# Patient Record
Sex: Female | Born: 1955 | Race: White | Hispanic: No
Health system: Western US, Academic
[De-identification: ages and names within clinical notes are randomized; demographics above are authoritative.]

## PROBLEM LIST (undated history)

## (undated) DIAGNOSIS — E119 Type 2 diabetes mellitus without complications: Secondary | ICD-10-CM

## (undated) DIAGNOSIS — I10 Essential (primary) hypertension: Secondary | ICD-10-CM

## (undated) DIAGNOSIS — E785 Hyperlipidemia, unspecified: Secondary | ICD-10-CM

## (undated) DIAGNOSIS — T7840XA Allergy, unspecified, initial encounter: Secondary | ICD-10-CM

## (undated) HISTORY — DX: Type 2 diabetes mellitus without complications: E11.9

## (undated) HISTORY — DX: Allergy, unspecified, initial encounter: T78.40XA

## (undated) HISTORY — DX: Hyperlipidemia, unspecified: E78.5

---

## 2009-08-18 ENCOUNTER — Ambulatory Visit: Payer: Self-pay | Admitting: Internal Medicine

## 2010-06-25 LAB — HM COLONOSCOPY: HM Colonoscopy: NORMAL

## 2011-05-22 ENCOUNTER — Ambulatory Visit: Payer: Self-pay | Admitting: Internal Medicine

## 2011-06-03 ENCOUNTER — Ambulatory Visit: Payer: Self-pay | Admitting: Internal Medicine

## 2011-07-01 ENCOUNTER — Ambulatory Visit: Payer: Self-pay | Admitting: Gastroenterology

## 2011-09-18 DIAGNOSIS — J309 Allergic rhinitis, unspecified: Secondary | ICD-10-CM | POA: Insufficient documentation

## 2012-05-26 ENCOUNTER — Ambulatory Visit: Payer: Self-pay | Admitting: Internal Medicine

## 2013-05-31 LAB — HM PAP SMEAR: HM PAP: NORMAL

## 2013-06-02 ENCOUNTER — Ambulatory Visit: Payer: Self-pay | Admitting: Family Medicine

## 2013-07-05 ENCOUNTER — Ambulatory Visit: Payer: Self-pay | Admitting: Adult Health

## 2014-06-03 LAB — HEPATIC FUNCTION PANEL
ALK PHOS: 78 U/L (ref 25–125)
ALT: 31 U/L (ref 7–35)
AST: 23 U/L (ref 13–35)
BILIRUBIN, TOTAL: 0.4 mg/dL

## 2014-06-03 LAB — BASIC METABOLIC PANEL
BUN: 13 mg/dL (ref 4–21)
Creatinine: 0.8 mg/dL (ref <=1.1)
Glucose: 122 mg/dL
Potassium: 4.4 mmol/L (ref 3.4–5.3)
Sodium: 140 mmol/L (ref 137–147)

## 2014-06-03 LAB — CBC AND DIFFERENTIAL
HCT: 41 % (ref 36–46)
Hemoglobin: 14.5 g/dL (ref 12.0–16.0)
NEUTROS ABS: 44 /uL
PLATELETS: 358 10*3/uL (ref 150–399)
WBC: 6.3 10^3/mL

## 2014-06-03 LAB — HEMOGLOBIN A1C: HEMOGLOBIN A1C: 6.5 % — AB (ref 4.0–6.0)

## 2014-06-03 LAB — LIPID PANEL
CHOLESTEROL: 248 mg/dL — AB (ref 0–200)
HDL: 53 mg/dL (ref 35–70)
LDL CALC: 157 mg/dL
LDl/HDL Ratio: 3
TRIGLYCERIDES: 189 mg/dL — AB (ref 40–160)

## 2014-06-03 LAB — HM MAMMOGRAPHY: HM Mammogram: ABNORMAL

## 2014-06-03 LAB — TSH: TSH: 2.58 u[IU]/mL (ref 0.41–5.90)

## 2014-06-15 ENCOUNTER — Ambulatory Visit: Payer: Self-pay | Admitting: Family Medicine

## 2014-07-04 ENCOUNTER — Ambulatory Visit: Payer: Self-pay | Admitting: Family Medicine

## 2014-10-20 DIAGNOSIS — Z111 Encounter for screening for respiratory tuberculosis: Secondary | ICD-10-CM | POA: Insufficient documentation

## 2014-10-20 DIAGNOSIS — Z124 Encounter for screening for malignant neoplasm of cervix: Secondary | ICD-10-CM | POA: Insufficient documentation

## 2014-10-20 DIAGNOSIS — M25569 Pain in unspecified knee: Secondary | ICD-10-CM | POA: Insufficient documentation

## 2014-10-20 DIAGNOSIS — Z2821 Immunization not carried out because of patient refusal: Secondary | ICD-10-CM | POA: Insufficient documentation

## 2014-10-20 DIAGNOSIS — Z1239 Encounter for other screening for malignant neoplasm of breast: Secondary | ICD-10-CM | POA: Insufficient documentation

## 2014-10-20 DIAGNOSIS — Z0000 Encounter for general adult medical examination without abnormal findings: Secondary | ICD-10-CM | POA: Insufficient documentation

## 2014-10-20 DIAGNOSIS — E119 Type 2 diabetes mellitus without complications: Secondary | ICD-10-CM | POA: Insufficient documentation

## 2014-10-20 DIAGNOSIS — Z131 Encounter for screening for diabetes mellitus: Secondary | ICD-10-CM | POA: Insufficient documentation

## 2014-10-20 DIAGNOSIS — I10 Essential (primary) hypertension: Secondary | ICD-10-CM | POA: Insufficient documentation

## 2014-10-20 DIAGNOSIS — Z23 Encounter for immunization: Secondary | ICD-10-CM | POA: Insufficient documentation

## 2014-10-20 DIAGNOSIS — E663 Overweight: Secondary | ICD-10-CM | POA: Insufficient documentation

## 2014-10-20 DIAGNOSIS — Z87898 Personal history of other specified conditions: Secondary | ICD-10-CM | POA: Insufficient documentation

## 2014-10-20 DIAGNOSIS — IMO0002 Reserved for concepts with insufficient information to code with codable children: Secondary | ICD-10-CM | POA: Insufficient documentation

## 2014-10-20 DIAGNOSIS — Z1331 Encounter for screening for depression: Secondary | ICD-10-CM | POA: Insufficient documentation

## 2014-10-20 DIAGNOSIS — E785 Hyperlipidemia, unspecified: Secondary | ICD-10-CM | POA: Insufficient documentation

## 2014-12-12 ENCOUNTER — Ambulatory Visit: Payer: Self-pay | Admitting: Family Medicine

## 2014-12-19 ENCOUNTER — Ambulatory Visit: Payer: Self-pay | Admitting: Family Medicine

## 2014-12-21 ENCOUNTER — Ambulatory Visit (INDEPENDENT_AMBULATORY_CARE_PROVIDER_SITE_OTHER): Payer: BLUE CROSS/BLUE SHIELD | Admitting: Family Medicine

## 2014-12-21 ENCOUNTER — Encounter: Payer: Self-pay | Admitting: Family Medicine

## 2014-12-21 VITALS — BP 124/76 | HR 76 | Temp 98.0°F | Resp 18 | Ht 65.0 in | Wt 171.9 lb

## 2014-12-21 DIAGNOSIS — E119 Type 2 diabetes mellitus without complications: Secondary | ICD-10-CM | POA: Diagnosis not present

## 2014-12-21 LAB — POCT UA - MICROALBUMIN: Microalbumin Ur, POC: 50 mg/L

## 2014-12-21 LAB — POCT GLYCOSYLATED HEMOGLOBIN (HGB A1C): Hemoglobin A1C: 6.3

## 2014-12-21 MED ORDER — METFORMIN HCL 500 MG PO TABS: 500.0000 mg | ORAL_TABLET | Freq: Every day | ORAL | Status: DC

## 2014-12-21 NOTE — Progress Notes (Signed)
Name: Karen Burgess   MRN: [id number]    DOB: [birth year]   Date:12/21/2014       Progress Note  Subjective  Chief Complaint  Chief Complaint  Patient presents with  . Follow-up    patient is here for a 85-month follow-up. patient states everything is going well.    HPI  Patient is here for routine follow up of Diabetes Type II.  Current diabetes medication regimen includes Metformin  one a day. Patient is not taking medications as instructed. Taking metformin  one a day PRN basis but working mostly on lifestyle control. Overall the patient feels that their blood glucose is well controlled. Not checking blood glucose regularly. Associated diseases include HLD.  Last diabetic eye exam 11/23/2014 with normal findings. Associated symptoms do not include chest pain, shortness of breath, paresthesias, neuropathy, visual changes.    Patient Active Problem List   Diagnosis Date Noted  . Routine general medical examination at a health care facility 10/20/2014  . Blood pressure elevated without history of HTN 10/20/2014  . Screening for depression 10/20/2014  . Gravida 1 10/20/2014  . Calcium blood increased 10/20/2014  . HLD (hyperlipidemia) 10/20/2014  . Influenza vaccination declined 10/20/2014  . Gonalgia 10/20/2014  . Excess weight 10/20/2014  . Parity 1 10/20/2014  . Breast screening 10/20/2014  . Pap smear for cervical cancer screening 10/20/2014  . Screening for diabetes mellitus 10/20/2014  . Need for vaccination 10/20/2014  . Screening examination for pulmonary tuberculosis 10/20/2014  . Diabetes mellitus type 2, controlled, without complications 10/20/2014  . Allergic rhinitis 09/18/2011    History  Substance Use Topics  . Smoking status: Former Smoker    Types: Cigarettes    Quit date: 06/09/1984  . Smokeless tobacco: Not on file  . Alcohol Use: 0.0 oz/week    0 Standard drinks or equivalent per week     Comment: Occasional     Current outpatient  prescriptions:  .  metFORMIN (GLUCOPHAGE) 500 MG tablet, Take 1 tablet by mouth daily., Disp: , Rfl:   History reviewed. No pertinent past surgical history.  Family History  Problem Relation Age of Onset  . Prostate cancer Father   . Breast cancer Mother   . Breast cancer Sister   . Breast cancer Cousin     died at age 45  . Diabetes Mellitus I Paternal Aunt     No Known Allergies   Review of Systems  CONSTITUTIONAL: No significant weight changes, fever, chills, weakness or fatigue.  HEENT:  - Eyes: No visual changes.  - Ears: No auditory changes. No pain.  - Nose: No sneezing, congestion, runny nose. - Throat: No sore throat. No changes in swallowing. SKIN: No rash or itching.  CARDIOVASCULAR: No chest pain, chest pressure or chest discomfort. No palpitations or edema.  RESPIRATORY: No shortness of breath, cough or sputum.  GASTROINTESTINAL: No anorexia, nausea, vomiting. No changes in bowel habits. No abdominal pain or blood.  GENITOURINARY: No dysuria. No frequency. No discharge.  NEUROLOGICAL: No headache, dizziness, syncope, paralysis, ataxia, numbness or tingling in the extremities. No memory changes. No change in bowel or bladder control.  MUSCULOSKELETAL: No joint pain. No muscle pain. HEMATOLOGIC: No anemia, bleeding or bruising.  LYMPHATICS: No enlarged lymph nodes.  PSYCHIATRIC: No change in mood. No change in sleep pattern.  ENDOCRINOLOGIC: No reports of sweating, cold or heat intolerance. No polyuria or polydipsia.      Objective  BP 124/76 mmHg  Pulse 76  Temp(Src) 98  F (36.7 C) (Oral)  Resp 18  Ht 5\' 5"  (1.651 m)  Wt 171 lb 14.4 oz (77.973 kg)  BMI 28.61 kg/m2  SpO2 94% Body mass index is 28.61 kg/(m^2).  Physical Exam  Constitutional: Patient appears well-developed and well-nourished. In no distress.  HEENT:  - Head: Normocephalic and atraumatic.  - Ears: Bilateral TMs gray, no erythema or effusion - Nose: Nasal mucosa moist -  Mouth/Throat: Oropharynx is clear and moist. No tonsillar hypertrophy or erythema. No post nasal drainage.  - Eyes: Conjunctivae clear, EOM movements normal. PERRLA. No scleral icterus.  Neck: Normal range of motion. Neck supple. No JVD present. No thyromegaly present.  Cardiovascular: Normal rate, regular rhythm and normal heart sounds.  No murmur heard.  Pulmonary/Chest: Effort normal and breath sounds normal. No respiratory distress. Musculoskeletal: Normal range of motion bilateral UE and LE, no joint effusions. Peripheral vascular: Bilateral LE no edema. Neurological: CN II-XII grossly intact with no focal deficits. Alert and oriented to person, place, and time. Coordination, balance, strength, speech and gait are normal.  Skin: Skin is warm and dry. No rash noted. No erythema.  Psychiatric: Patient has a normal mood and affect. Behavior is normal in office today. Judgment and thought content normal in office today.  Diabetic Foot Exam - Simple   Simple Foot Form  Diabetic Foot exam was performed with the following findings:  Yes 12/21/2014  8:53 AM  Visual Inspection  No deformities, no ulcerations, no other skin breakdown bilaterally:  Yes  Sensation Testing  Intact to touch and monofilament testing bilaterally:  Yes  Pulse Check  Posterior Tibialis and Dorsalis pulse intact bilaterally:  Yes  Comments      Assessment & Plan  1. Diabetes mellitus type 2, controlled, without complications Hba1c 6.3% today which is at goal <6.5%. Continue Metformin 500mg  po qday prn with lifestyle changes.  At next visit we will get full lab panel, she has been instructed to call me 2-3 weeks prior to her appointment so I can order lab work.  - POCT UA - Microalbumin - POCT HgB A1C - metFORMIN (GLUCOPHAGE) 500 MG tablet; Take 1 tablet (500 mg total) by mouth daily.  Dispense: 30 tablet; Refill: 5

## 2015-06-06 ENCOUNTER — Ambulatory Visit
Admission: RE | Admit: 2015-06-06 | Discharge: 2015-06-06 | Disposition: A | Discharge status: Home / Self Care | Payer: BLUE CROSS/BLUE SHIELD | Source: Ambulatory Visit | Attending: Family Medicine | Admitting: Family Medicine

## 2015-06-06 ENCOUNTER — Encounter: Payer: Self-pay | Admitting: Family Medicine

## 2015-06-06 ENCOUNTER — Ambulatory Visit (INDEPENDENT_AMBULATORY_CARE_PROVIDER_SITE_OTHER): Payer: BLUE CROSS/BLUE SHIELD | Admitting: Family Medicine

## 2015-06-06 VITALS — BP 122/86 | HR 76 | Temp 98.6°F | Resp 16 | Ht 65.0 in | Wt 168.4 lb

## 2015-06-06 DIAGNOSIS — Z1231 Encounter for screening mammogram for malignant neoplasm of breast: Secondary | ICD-10-CM | POA: Diagnosis not present

## 2015-06-06 DIAGNOSIS — Z113 Encounter for screening for infections with a predominantly sexual mode of transmission: Secondary | ICD-10-CM | POA: Diagnosis not present

## 2015-06-06 DIAGNOSIS — E119 Type 2 diabetes mellitus without complications: Secondary | ICD-10-CM

## 2015-06-06 DIAGNOSIS — E785 Hyperlipidemia, unspecified: Secondary | ICD-10-CM

## 2015-06-06 DIAGNOSIS — Z2821 Immunization not carried out because of patient refusal: Secondary | ICD-10-CM | POA: Diagnosis not present

## 2015-06-06 DIAGNOSIS — M25512 Pain in left shoulder: Secondary | ICD-10-CM

## 2015-06-06 DIAGNOSIS — Z0000 Encounter for general adult medical examination without abnormal findings: Secondary | ICD-10-CM | POA: Insufficient documentation

## 2015-06-06 DIAGNOSIS — M19012 Primary osteoarthritis, left shoulder: Secondary | ICD-10-CM | POA: Diagnosis not present

## 2015-06-06 DIAGNOSIS — E1169 Type 2 diabetes mellitus with other specified complication: Secondary | ICD-10-CM | POA: Insufficient documentation

## 2015-06-06 LAB — POCT UA - MICROALBUMIN: MICROALBUMIN (UR) POC: 20 mg/L

## 2015-06-06 NOTE — Progress Notes (Signed)
Name: Karen Burgess   MRN: [id number]    DOB: [birth year]   Date:06/06/2015       Progress Note  Subjective  Chief Complaint  Chief Complaint  Patient presents with  . Annual Exam    HPI  Patient is here today for a Complete Female Physical Exam:  The patient has has no unusual complaints and complains of left shoulder pain. Intermitent ache scaled 3/10, with some associated restricted overhead ROM. Not debilitating or restrictive but has been going on for 3 months and she wanted to address it. Has never had rotator cuff issues or bursitis. Overall feels healthy. Diet is well balanced. Avoids sugars, artificial sweetners and excessive carbohydrates. In general does exercise, but irregularly. Sees dentist regularly and addresses vision concerns with ophthalmologist if applicable. Declines flu shot today.  Menstrual history is positive for post menopausal with no vaginal discharge or pelvic symptoms.  Current diabetes medication regimen includes Metformin  one a day. Patient is not taking medications as instructed. Taking metformin  one a day PRN basis but working mostly on lifestyle control. Overall the patient feels that their blood glucose is well controlled. Not checking blood glucose regularly. Associated diseases include HLD, not on statin therapy. Last diabetic eye exam 11/23/2014 with normal findings. Associated symptoms do not include chest pain, shortness of breath, paresthesias, neuropathy, visual changes.   Past Medical History  Diagnosis Date  . Diabetes mellitus without complication (HCC)     patient was classified as diabetic based upon lab results, but is well controlled by diet and exercise  . Allergy   . Hyperlipidemia     No past surgical history on file.  Family History  Problem Relation Age of Onset  . Prostate cancer Father   . Breast cancer Mother   . Breast cancer Sister   . Breast cancer Cousin     died at age 56  . Diabetes Mellitus I Paternal  Aunt     Social History   Social History  . Marital Status: Married    Spouse Name: Ceriah Kohler  . Number of Children: 2  . Years of Education: N/A   Occupational History  . [employer]   Social History Main Topics  . Smoking status: Former Smoker    Types: Cigarettes    Quit date: 06/09/1984  . Smokeless tobacco: Not on file  . Alcohol Use: 0.0 oz/week    0 Standard drinks or equivalent per week     Comment: Occasional  . Drug Use: No  . Sexual Activity:    Partners: Male    Birth Control/ Protection: None   Other Topics Concern  . Not on file   Social History Narrative     Current outpatient prescriptions:  .  Bee Pollen 580 MG CAPS, Take 1 tablet by mouth 3 (three) times daily., Disp: , Rfl:  .  metFORMIN (GLUCOPHAGE) 500 MG tablet, Take 1 tablet (500 mg total) by mouth daily., Disp: 30 tablet, Rfl: 5  No Known Allergies  ROS  CONSTITUTIONAL: No significant weight changes, fever, chills, weakness or fatigue.  HEENT:  - Eyes: No visual changes.  - Ears: No auditory changes. No pain.  - Nose: No sneezing, congestion, runny nose. - Throat: No sore throat. No changes in swallowing. SKIN: No rash or itching.  CARDIOVASCULAR: No chest pain, chest pressure or chest discomfort. No palpitations or edema.  RESPIRATORY: No shortness of breath, cough or sputum.  GASTROINTESTINAL: No anorexia, nausea, vomiting. No changes in bowel habits. No abdominal pain or blood.  GENITOURINARY: No dysuria. No frequency. No discharge.  NEUROLOGICAL: No headache, dizziness, syncope, paralysis, ataxia, numbness or tingling in the extremities. No memory changes. No change in bowel or bladder control.  MUSCULOSKELETAL: Left shoulder joint pain. No muscle pain. HEMATOLOGIC: No anemia, bleeding or bruising.  LYMPHATICS: No enlarged lymph nodes.  PSYCHIATRIC: No change in mood. No change in sleep pattern.  ENDOCRINOLOGIC: No reports of sweating, cold or  heat intolerance. No polyuria or polydipsia.   Objective  Filed Vitals:   06/06/15 0814  BP: 122/86  Pulse: 76  Temp: 98.6 F (37 C)  TempSrc: Oral  Resp: 16  Height:  (1.651 m)  Weight: 168 lb 6.4 oz (76.386 kg)  SpO2: 95%   Body mass index is 28.02 kg/(m^2).  Diabetic Foot Exam - Simple   Simple Foot Form  Diabetic Foot exam was performed with the following findings:  Yes 06/06/2015  8:43 AM  Visual Inspection  No deformities, no ulcerations, no other skin breakdown bilaterally:  Yes  Sensation Testing  Intact to touch and monofilament testing bilaterally:  Yes  Pulse Check  Posterior Tibialis and Dorsalis pulse intact bilaterally:  Yes  Comments  Some discoloration to left foot 1st toenail, bruise growing out.      Physical Exam  Constitutional: Patient appears well-developed and well-nourished. In no distress.  HEENT:  - Head: Normocephalic and atraumatic.  - Ears: Bilateral TMs gray, no erythema or effusion - Nose: Nasal mucosa moist - Mouth/Throat: Oropharynx is clear and moist. No tonsillar hypertrophy or erythema. No post nasal drainage.  - Eyes: Conjunctivae clear, EOM movements normal. PERRLA. No scleral icterus.  Neck: Normal range of motion. Neck supple. No JVD present. No thyromegaly present.  Cardiovascular: Normal rate, regular rhythm and normal heart sounds.  No murmur heard.  Pulmonary/Chest: Effort normal and breath sounds normal. No respiratory distress. Abdominal: Soft. Bowel sounds are normal, no distension. There is no tenderness. no masses BREAST: Bilateral breast exam normal with no masses, skin changes or nipple discharge FEMALE GENITALIA: Deferred RECTAL: Deferred  Musculoskeletal: Normal range of motion bilateral UE and LE, no joint effusions. Left shoulder some reported pain with empty can testing and Hawkins' testing, negative left off testing. ROM intact however more painful with overhead movements.  Peripheral vascular: Bilateral LE  no edema. Neurological: CN II-XII grossly intact with no focal deficits. Alert and oriented to person, place, and time. Coordination, balance, strength, speech and gait are normal.  Skin: Skin is warm and dry. No rash noted. No erythema. Scattered hyperpigmented nevi. Psychiatric: Patient has a normal mood and affect. Behavior is normal in office today. Judgment and thought content normal in office today.  Results for orders placed or performed in visit on 06/06/15 (from the past 24 hour(s))  POCT UA - Microalbumin     Status: None   Collection Time: 06/06/15  8:47 AM  Result Value Ref Range   Microalbumin Ur, POC 20 mg/L   Creatinine, POC  mg/dL   Albumin/Creatinine Ratio, Urine, POC      Assessment & Plan  1. Annual physical exam Discussed in detail all recommended preventative measures appropriate for age and gender now and in the future. Recommended dermatology surveillance of skin lesions. She will call me if her insurance requires me to place referral.   - CBC with Differential/Platelet - Comprehensive metabolic panel - Hemoglobin A1c - Lipid panel - TSH -  Hepatitis C antibody - HIV antibody  2. Encounter for screening mammogram for malignant neoplasm of breast  - MM Digital Screening; Future  3. Screening for STD (sexually transmitted disease)  - Hepatitis C antibody - HIV antibody  4. Controlled type 2 diabetes mellitus without complication, without long-term current use of insulin (HCC) Patient's Hba1c goal is <6.5% for stringent control.  Patient's Hba1c goal is <7% is acceptable  Reviewed diet, exercise, lifestyle changes and current medication regimen pertaining to diabetes with the patient.   Reminded patient of the required annual dilated retinal exam.   - CBC with Differential/Platelet - Comprehensive metabolic panel - Hemoglobin A1c - Lipid panel - TSH - POCT UA - Microalbumin; Standing - POCT UA - Microalbumin  5. HLD (hyperlipidemia) Not on  statin therapy, if elevated on blood work will recommend initiating.   - CBC with Differential/Platelet - Comprehensive metabolic panel - Hemoglobin A1c - Lipid panel - TSH  6. Left shoulder pain May be arthritis vs bursitis. Rotator pathology less likely but will keep in mind. Recommend x-ray imaging and PT.  - DG Shoulder Left; Future - Ambulatory referral to Physical Therapy  7. Influenza vaccination declined by patient

## 2015-06-07 LAB — COMPREHENSIVE METABOLIC PANEL
ALT: 25 IU/L (ref 0–32)
AST: 15 IU/L (ref 0–40)
Albumin/Globulin Ratio: 1.9 (ref 1.1–2.5)
Albumin: 4.4 g/dL (ref 3.6–4.8)
Alkaline Phosphatase: 84 IU/L (ref 39–117)
BUN/Creatinine Ratio: 16 (ref 11–26)
BUN: 14 mg/dL (ref 8–27)
Bilirubin Total: 0.3 mg/dL (ref 0.0–1.2)
CALCIUM: 9.4 mg/dL (ref 8.7–10.3)
CO2: 24 mmol/L (ref 18–29)
CREATININE: 0.86 mg/dL (ref 0.57–1.00)
Chloride: 100 mmol/L (ref 96–106)
GFR calc Af Amer: 84 mL/min/{1.73_m2} (ref >59)
GFR, EST NON AFRICAN AMERICAN: 73 mL/min/{1.73_m2} (ref >59)
GLOBULIN, TOTAL: 2.3 g/dL (ref 1.5–4.5)
Glucose: 108 mg/dL — ABNORMAL HIGH (ref 65–99)
Potassium: 4.2 mmol/L (ref 3.5–5.2)
Sodium: 140 mmol/L (ref 134–144)
Total Protein: 6.7 g/dL (ref 6.0–8.5)

## 2015-06-07 LAB — LIPID PANEL
CHOLESTEROL TOTAL: 215 mg/dL — AB (ref 100–199)
Chol/HDL Ratio: 4.7 ratio units — ABNORMAL HIGH (ref 0.0–4.4)
HDL: 46 mg/dL (ref >39)
LDL Calculated: 130 mg/dL — ABNORMAL HIGH (ref 0–99)
Triglycerides: 195 mg/dL — ABNORMAL HIGH (ref 0–149)
VLDL CHOLESTEROL CAL: 39 mg/dL (ref 5–40)

## 2015-06-07 LAB — CBC WITH DIFFERENTIAL/PLATELET
Basophils Absolute: 0 10*3/uL (ref 0.0–0.2)
Basos: 1 %
EOS (ABSOLUTE): 0.1 10*3/uL (ref 0.0–0.4)
EOS: 1 %
HEMATOCRIT: 39.8 % (ref 34.0–46.6)
Hemoglobin: 13.5 g/dL (ref 11.1–15.9)
IMMATURE GRANULOCYTES: 0 %
Immature Grans (Abs): 0 10*3/uL (ref 0.0–0.1)
LYMPHS: 54 %
Lymphocytes Absolute: 3.3 10*3/uL — ABNORMAL HIGH (ref 0.7–3.1)
MCH: 27.9 pg (ref 26.6–33.0)
MCHC: 33.9 g/dL (ref 31.5–35.7)
MCV: 82 fL (ref 79–97)
MONOS ABS: 0.5 10*3/uL (ref 0.1–0.9)
Monocytes: 7 %
NEUTROS PCT: 37 %
Neutrophils Absolute: 2.3 10*3/uL (ref 1.4–7.0)
PLATELETS: 322 10*3/uL (ref 150–379)
RBC: 4.84 x10E6/uL (ref 3.77–5.28)
RDW: 14 % (ref 12.3–15.4)
WBC: 6.2 10*3/uL (ref 3.4–10.8)

## 2015-06-07 LAB — HEMOGLOBIN A1C
ESTIMATED AVERAGE GLUCOSE: 140 mg/dL
Hgb A1c MFr Bld: 6.5 % — ABNORMAL HIGH (ref 4.8–5.6)

## 2015-06-07 LAB — HIV ANTIBODY (ROUTINE TESTING W REFLEX): HIV Screen 4th Generation wRfx: NONREACTIVE

## 2015-06-07 LAB — TSH: TSH: 2.25 u[IU]/mL (ref 0.450–4.500)

## 2015-06-07 LAB — HEPATITIS C ANTIBODY: Hep C Virus Ab: <0.1 s/co ratio (ref 0.0–0.9)

## 2015-06-14 ENCOUNTER — Ambulatory Visit
Admission: RE | Admit: 2015-06-14 | Discharge: 2015-06-14 | Disposition: A | Discharge status: Home / Self Care | Payer: BLUE CROSS/BLUE SHIELD | Source: Ambulatory Visit | Attending: Family Medicine | Admitting: Family Medicine

## 2015-06-14 DIAGNOSIS — Z1231 Encounter for screening mammogram for malignant neoplasm of breast: Secondary | ICD-10-CM

## 2015-06-29 ENCOUNTER — Ambulatory Visit
Admission: RE | Admit: 2015-06-29 | Discharge: 2015-06-29 | Disposition: A | Discharge status: Home / Self Care | Payer: BLUE CROSS/BLUE SHIELD | Source: Ambulatory Visit | Attending: Family Medicine | Admitting: Family Medicine

## 2015-06-29 ENCOUNTER — Telehealth: Payer: Self-pay

## 2015-06-29 DIAGNOSIS — Z1231 Encounter for screening mammogram for malignant neoplasm of breast: Secondary | ICD-10-CM | POA: Insufficient documentation

## 2015-06-29 NOTE — Telephone Encounter (Signed)
Patient called asking me to send [facility] ATTN: Eileen StanfordJenna to Fax# [phone], I advised her that Dr. Sherley BoundsSundaram is out of the office today and will return on Friday and I'll be glad to take care of that when she returns. I'll get the order ready and have on Dr. Oralia ManisSundarams desk for her to sign for when she returns on Friday. Thanks

## 2015-08-12 IMAGING — MG MM DIGITAL SCREENING BILAT W/ CAD
2 series · 5 of 5 positions shown · non-contrast
Comparison: 06/02/2013 and earlier.

CLINICAL DATA: Screening.

EXAM:
DIGITAL SCREENING BILATERAL MAMMOGRAM WITH CAD

[R CC · right · 4 of 4 slices shown (1 of 2)]
[im 1/4]
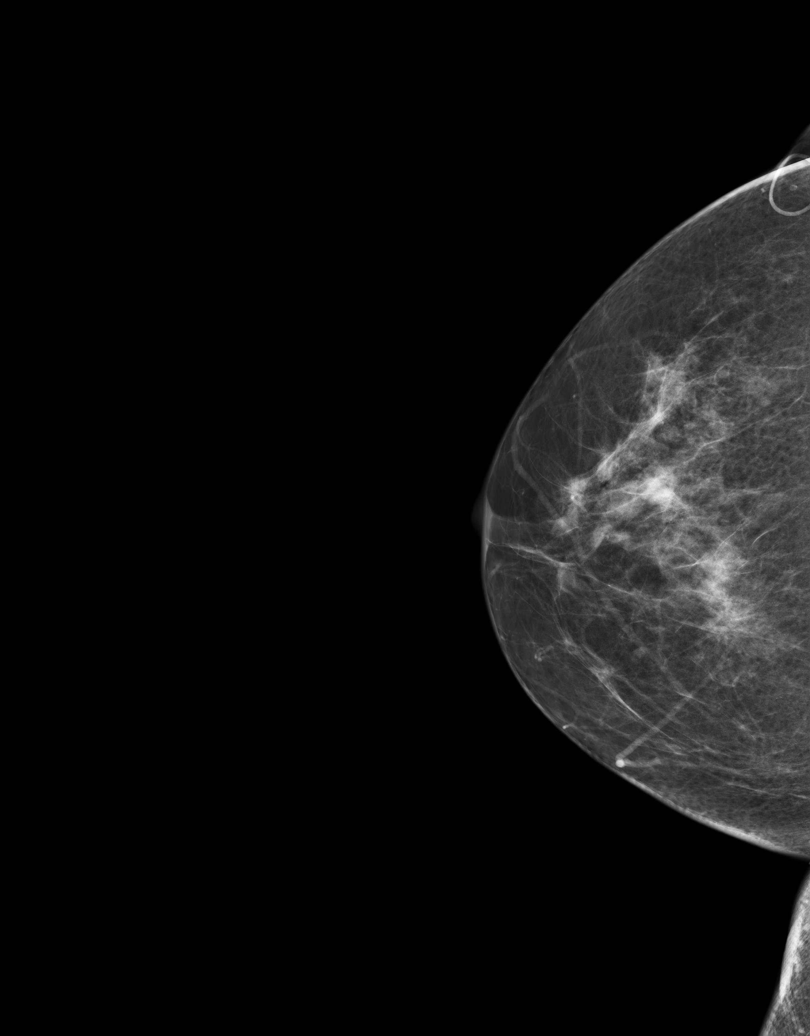
[im 2/4]
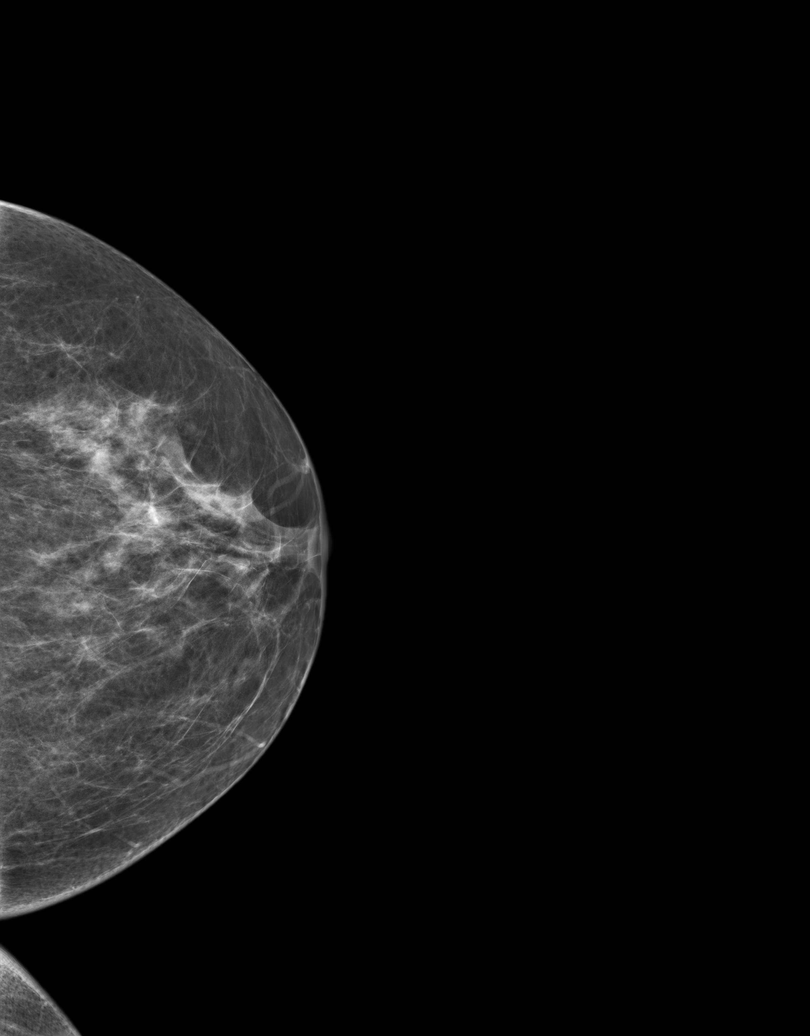
[im 3/4]
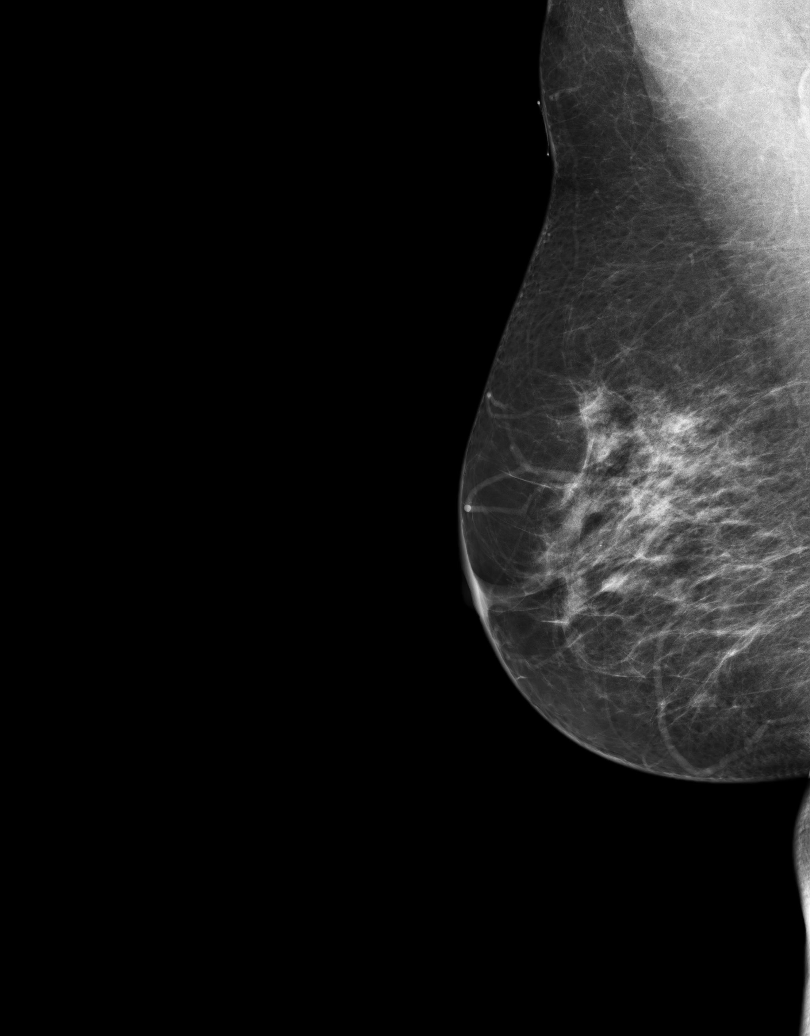
[im 4/4]
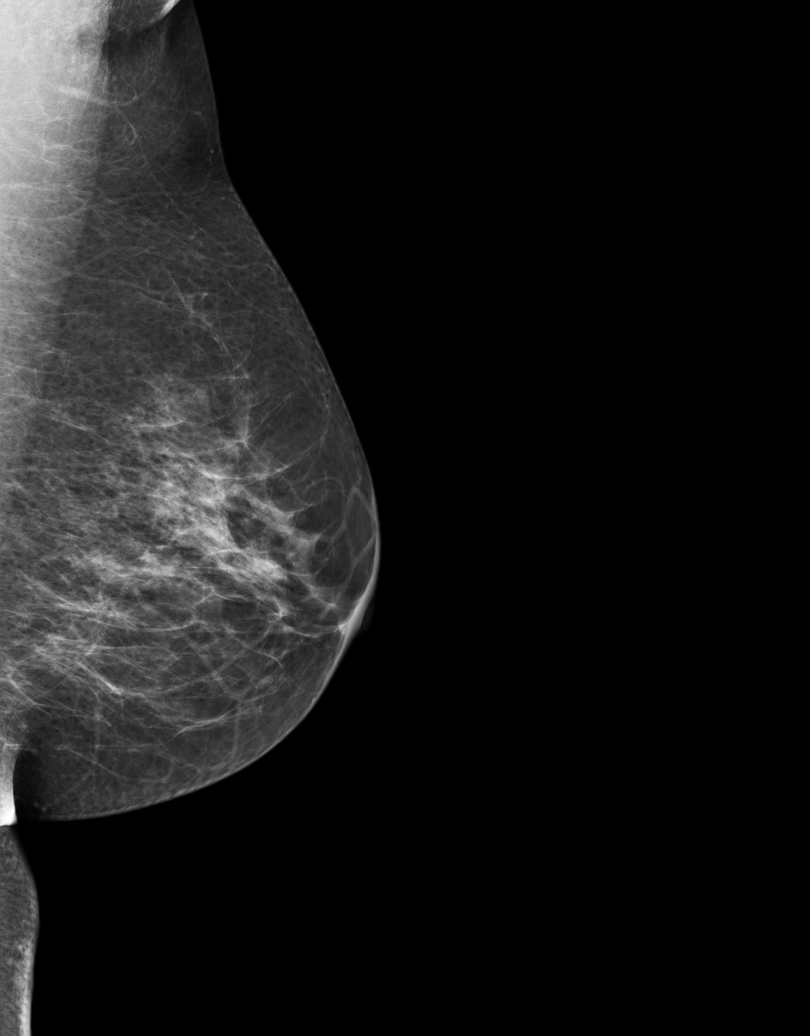

[R CC (2 of 2)]
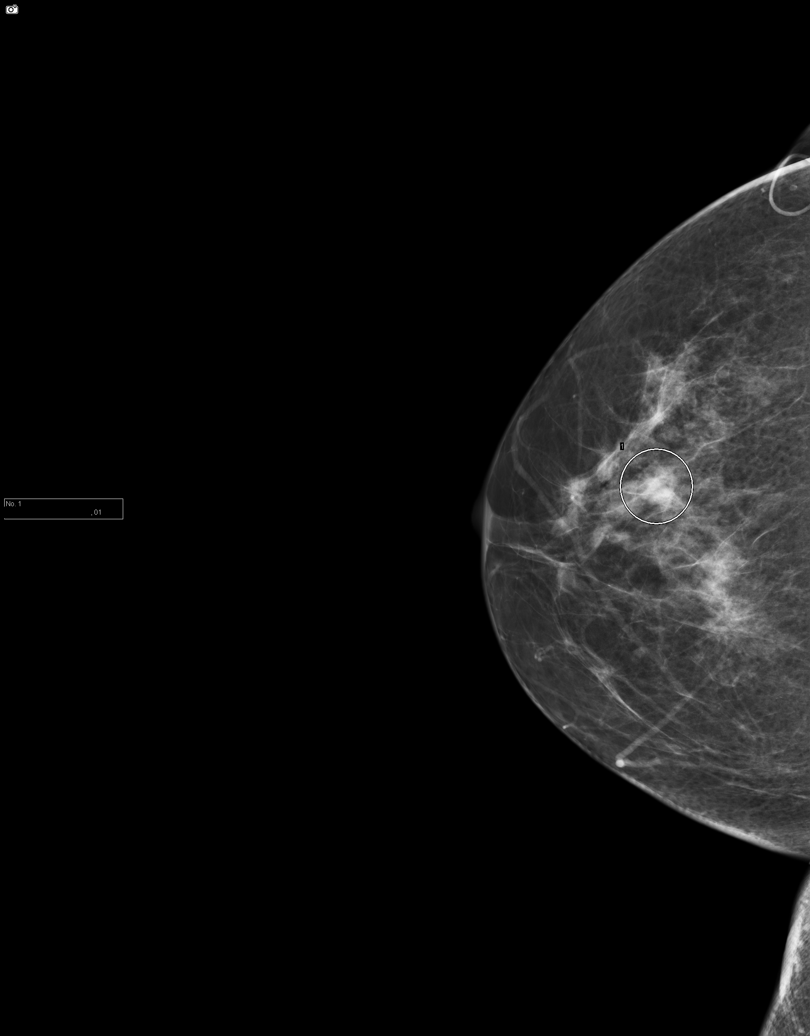

[5 of 5 positions shown; findings below may reference images not displayed]

ACR Breast Density Category c: The breast tissue is heterogeneously
dense, which may obscure small masses.
FINDINGS: In the right breast, a possible asymmetry warrants further
evaluation with spot compression views and possibly ultrasound. In
the left breast, no findings suspicious for malignancy. Images were
processed with CAD.
IMPRESSION: Further evaluation is suggested for possible asymmetry in the right
breast.

RECOMMENDATION:
Diagnostic mammogram and possibly ultrasound of the right breast.
(Code:[id number])

The patient will be contacted regarding the findings, and additional
imaging will be scheduled.

BI-RADS CATEGORY  0: Incomplete. Need additional imaging evaluation
and/or prior mammograms for comparison.

## 2015-08-31 IMAGING — MG MM ADDITIONAL VIEWS AT NO CHARGE
3 series · 3 of 3 positions shown · non-contrast
Comparison: 06/02/2013 and earlier.

CLINICAL DATA: Right breast asymmetry

EXAM:
DIGITAL DIAGNOSTIC  RIGHT MAMMOGRAM WITH CAD

[R CC (1 of 2)]
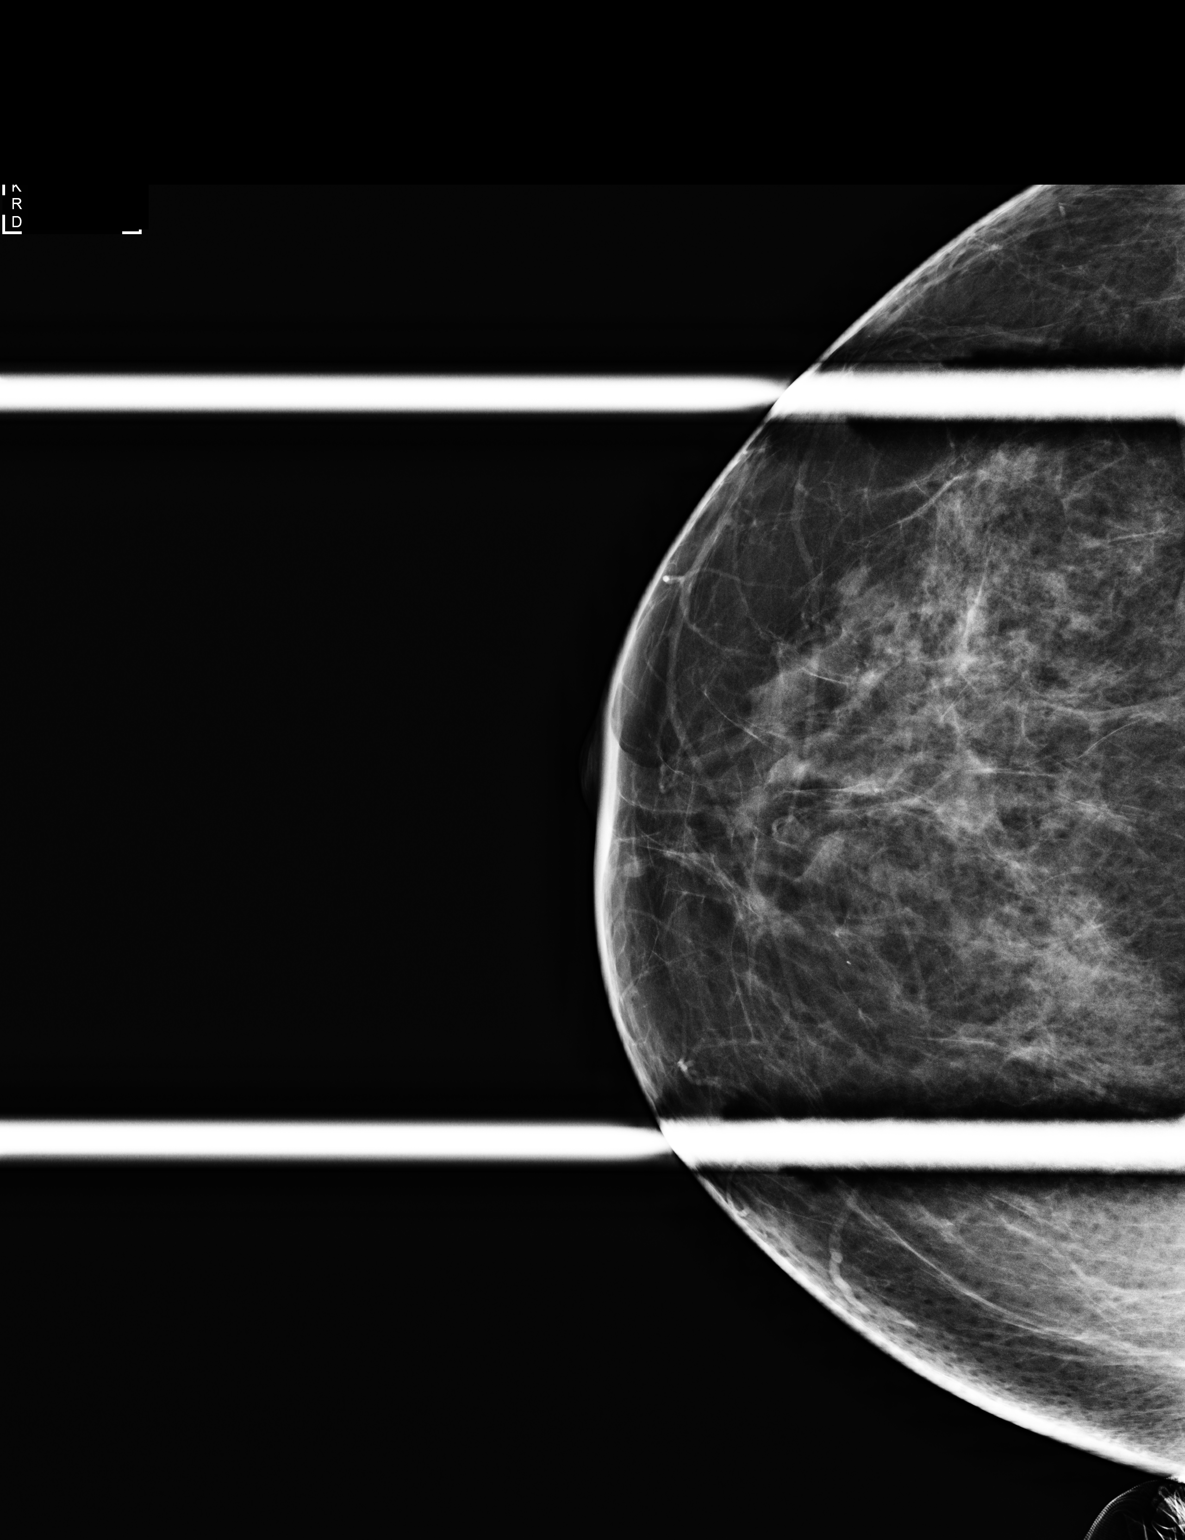

[R CC (2 of 2)]
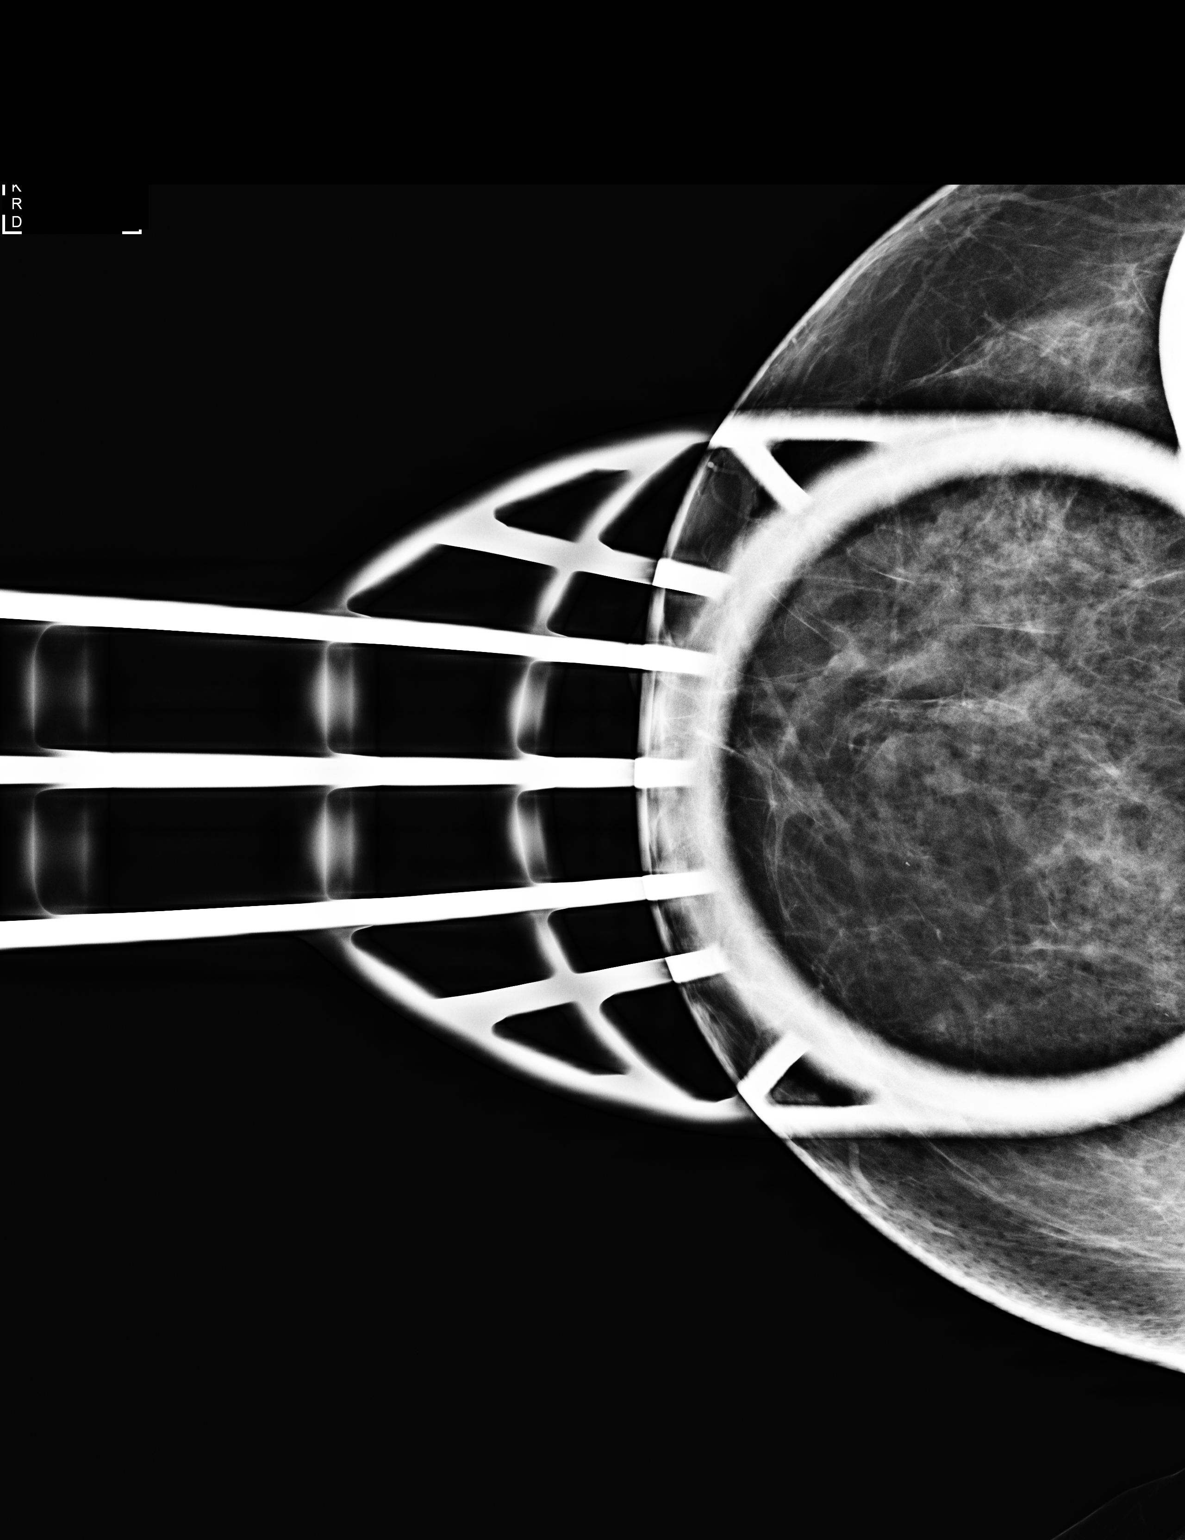

[R ML]
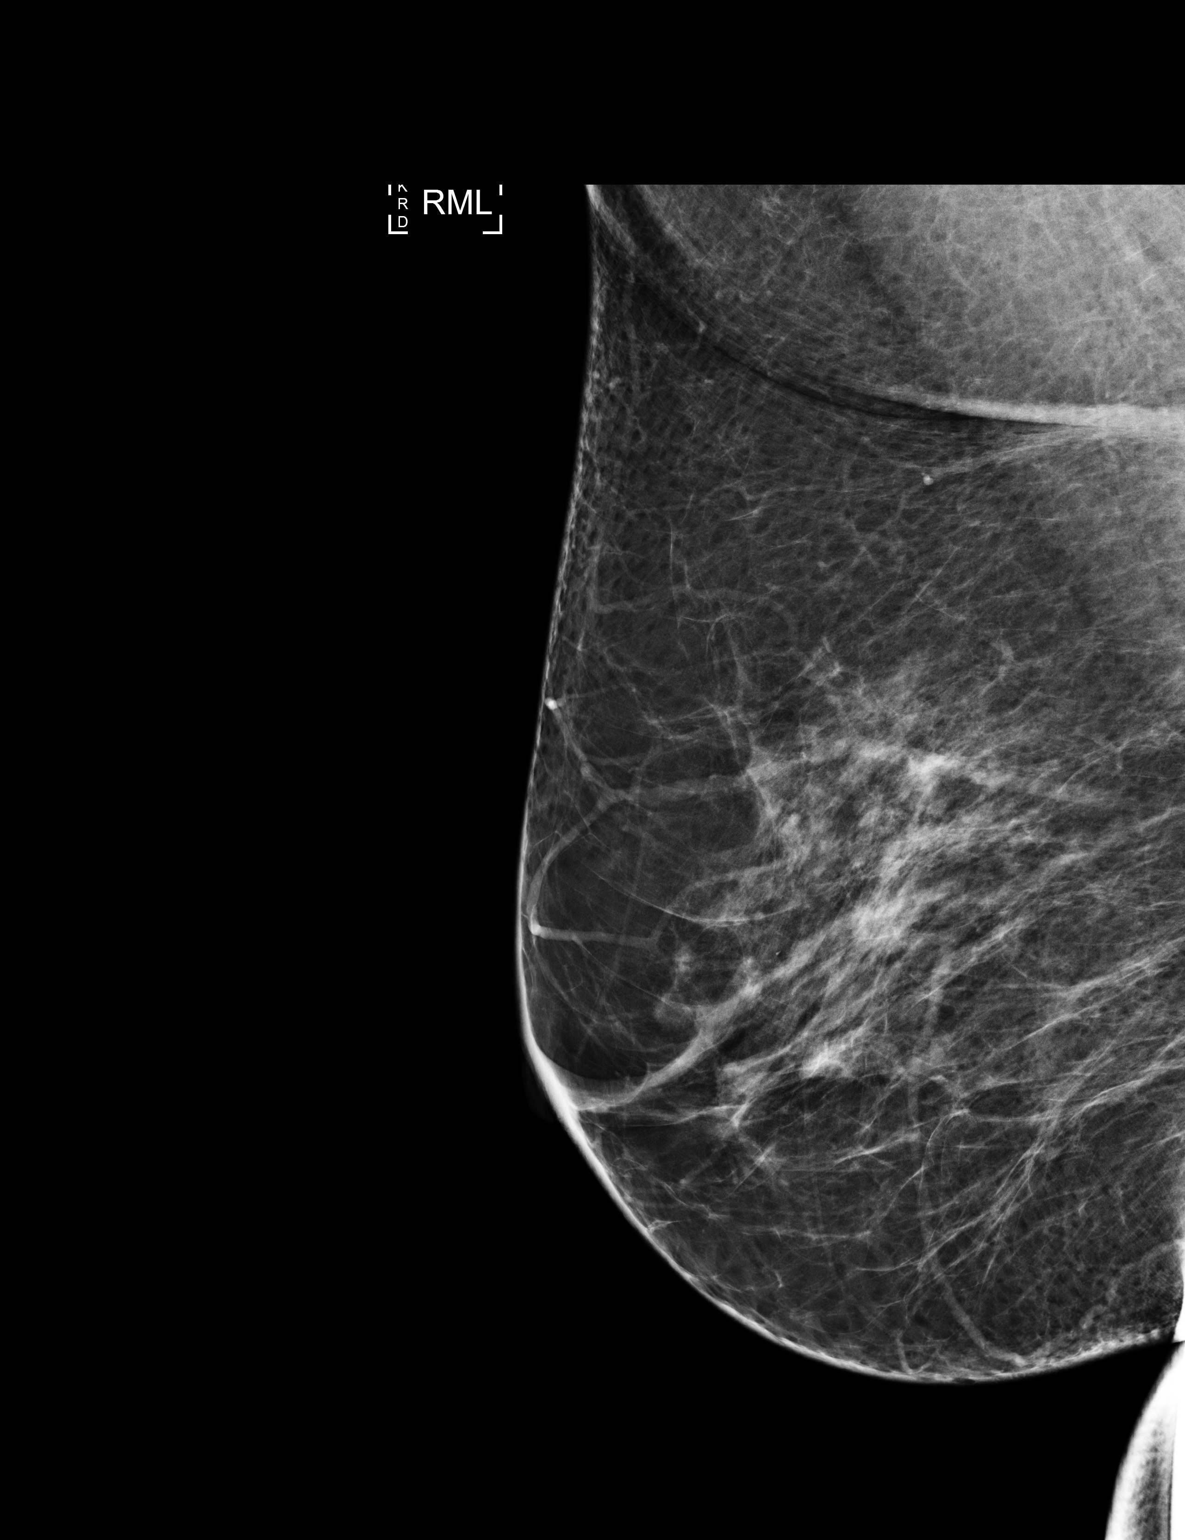

[3 of 3 positions shown; findings below may reference images not displayed]

ACR Breast Density Category c: The breast tissue is heterogeneously
dense, which may obscure small masses.
FINDINGS: The patient returns for additional views of a possible asymmetry
identified in the slightly lateral right breast, middle depth. This
asymmetry effaces to the patient's usual baseline on additional
imaging. No suspicious underlying mass, distortion or calcifications
are identified.

Mammographic images were processed with CAD.
IMPRESSION: No mammographic evidence of malignancy.

RECOMMENDATION:
Recommend annual screening mammograms. The patient should return
sooner if clinically indicated.

I have discussed the findings and recommendations with the patient.
Results were also provided in writing at the conclusion of the
visit. If applicable, a reminder letter will be sent to the patient
regarding the next appointment.

BI-RADS CATEGORY  1: Negative

## 2016-06-05 ENCOUNTER — Encounter: Payer: Self-pay | Admitting: Internal Medicine

## 2016-06-05 ENCOUNTER — Ambulatory Visit (INDEPENDENT_AMBULATORY_CARE_PROVIDER_SITE_OTHER): Payer: BLUE CROSS/BLUE SHIELD | Admitting: Internal Medicine

## 2016-06-05 VITALS — BP 158/94 | HR 64 | Temp 97.6°F | Resp 12 | Ht 65.0 in | Wt 166.5 lb

## 2016-06-05 DIAGNOSIS — E559 Vitamin D deficiency, unspecified: Secondary | ICD-10-CM | POA: Diagnosis not present

## 2016-06-05 DIAGNOSIS — Z1239 Encounter for other screening for malignant neoplasm of breast: Secondary | ICD-10-CM

## 2016-06-05 DIAGNOSIS — E663 Overweight: Secondary | ICD-10-CM

## 2016-06-05 DIAGNOSIS — E785 Hyperlipidemia, unspecified: Secondary | ICD-10-CM

## 2016-06-05 DIAGNOSIS — R030 Elevated blood-pressure reading, without diagnosis of hypertension: Secondary | ICD-10-CM

## 2016-06-05 DIAGNOSIS — E7800 Pure hypercholesterolemia, unspecified: Secondary | ICD-10-CM

## 2016-06-05 DIAGNOSIS — E119 Type 2 diabetes mellitus without complications: Secondary | ICD-10-CM | POA: Diagnosis not present

## 2016-06-05 DIAGNOSIS — Z1231 Encounter for screening mammogram for malignant neoplasm of breast: Secondary | ICD-10-CM | POA: Diagnosis not present

## 2016-06-05 DIAGNOSIS — E1169 Type 2 diabetes mellitus with other specified complication: Secondary | ICD-10-CM

## 2016-06-05 LAB — MICROALBUMIN / CREATININE URINE RATIO
Creatinine,U: 39.6 mg/dL
Microalb Creat Ratio: 1.8 mg/g (ref 0.0–30.0)
Microalb, Ur: 0.7 mg/dL (ref 0.0–1.9)

## 2016-06-05 LAB — COMPREHENSIVE METABOLIC PANEL
ALBUMIN: 4.7 g/dL (ref 3.5–5.2)
ALK PHOS: 85 U/L (ref 39–117)
ALT: 21 U/L (ref 0–35)
AST: 15 U/L (ref 0–37)
BILIRUBIN TOTAL: 0.4 mg/dL (ref 0.2–1.2)
BUN: 14 mg/dL (ref 6–23)
CO2: 31 mEq/L (ref 19–32)
Calcium: 10 mg/dL (ref 8.4–10.5)
Chloride: 101 mEq/L (ref 96–112)
Creatinine, Ser: 0.82 mg/dL (ref 0.40–1.20)
GFR: 75.01 mL/min (ref >60.00)
GLUCOSE: 109 mg/dL — AB (ref 70–99)
POTASSIUM: 4.6 meq/L (ref 3.5–5.1)
Sodium: 140 mEq/L (ref 135–145)
TOTAL PROTEIN: 7.5 g/dL (ref 6.0–8.3)

## 2016-06-05 LAB — HEMOGLOBIN A1C: HEMOGLOBIN A1C: 6.5 % (ref 4.6–6.5)

## 2016-06-05 LAB — LIPID PANEL
Cholesterol: 238 mg/dL — ABNORMAL HIGH (ref 0–200)
HDL: 52.1 mg/dL (ref >39.00)
LDL CALC: 150 mg/dL — AB (ref 0–99)
NONHDL: 185.98
Total CHOL/HDL Ratio: 5
Triglycerides: 182 mg/dL — ABNORMAL HIGH (ref 0.0–149.0)
VLDL: 36.4 mg/dL (ref 0.0–40.0)

## 2016-06-05 LAB — LDL CHOLESTEROL, DIRECT: Direct LDL: 171 mg/dL

## 2016-06-05 LAB — VITAMIN D 25 HYDROXY (VIT D DEFICIENCY, FRACTURES): VITD: 26.65 ng/mL — AB (ref 30.00–100.00)

## 2016-06-05 NOTE — Patient Instructions (Addendum)
Nice to meet you!  If your a1c today is < 7.0,  I recommend you see me every 6 months.  If it is >7.0,  Every 3 months is advised  We'll do your physical with PAP smear in 3 months  Mammogram is scheduled for [address] [facility]  At 10:20 am   You need to have a "diabetic eye exam" once a year     You might want to try a  Low carb , premixed protein drink  For breakfast .  Here are my top choices:  1) Premier Protein:  Nutritional analysis :  160 cal  30 g protein  1 g sugar 50% calcium needs   Nicolette [employer] Mart and [employer]  2) Atkins advantage  3) EAS Carb Control    Vilma MeckelJimmy Dean now makes a frozen breakfast frittata that can be microwaved in 2 minutes and is very low carb.      To make a low carb chip :  Take the Joseph's Lavash or Pita bread,  Or the [place] Low carb whole wheat tortilla   Place on metal cookie sheet  Brush with olive oil  Sprinkle garlic powder (NOT garlic salt), grated parmesan cheese, mediterranean seasoning , or all of them?  Bake at 275 for 30 minutes   We have substitutions for your potatoes!!  Try the mashed cauliflower and riced cauliflower dishes instead of rice and mashed potatoes  Mashed turnips are also very low carb!   For desserts :  Try the Dannon Lt n Fit greek yogurt dessert flavors and top with reddi Whip .  8 carbs,  80 calories  Try Oikos Triple Zero AustriaGreek Yogurt in the salted caramel, and the coffee flavors  With Whipped Cream for dessert  breyer's low carb ice cream, available in bars (on a stick, better ) or scoopable ice cream  HERE ARE THE LOW CARB  BREAD CHOICES

## 2016-06-05 NOTE — Progress Notes (Signed)
Subjective:  Patient ID: Karen SanteeCarla Kling Burgess, female    DOB: 10/31/1955  Age: 60 y.o. MRN: [id number]  CC: The primary encounter diagnosis was Breast cancer screening, high risk patient. Diagnoses of Pure hypercholesterolemia, Controlled type 2 diabetes mellitus without complication, without long-term current use of insulin (HCC), Vitamin D deficiency, Blood pressure elevated without history of HTN, Hyperlipidemia due to type 2 diabetes mellitus (HCC), and Overweight (BMI 25.0-29.9) were also pertinent to this visit.  HPI Karen Burgess presents for establishment of care . Referred by Renato Gailserri Stevens  Her sister  Last PAP 2014,  Last CPE one year ago Mammogram 3d needed   Diagnosed with diabetes last year this [facility].  Does not check sugars,  Has not had diabetes follow up.  Last a1c one year ago   Foot exam normal today .  Has not had diabetic eye exam .  Low glycemic index discussed and reviewed    Doesn't want flu vaccine Agrees to  pneumovax. and Tdap  History Karen Burgess has a past medical history of Allergy; Diabetes mellitus without complication (HCC); and Hyperlipidemia.   She has no past surgical history on file.   Her family history includes Breast cancer in her cousin; Breast cancer (age of onset: 1961) in her sister; Breast cancer (age of onset: 8573) in her mother; Diabetes Mellitus I in her paternal aunt; Prostate cancer in her father.She reports that she quit smoking about 32 years ago. Her smoking use included Cigarettes. She has never used smokeless tobacco. She reports that she drinks alcohol. She reports that she does not use drugs.  Outpatient Medications Prior to Visit  Medication Sig Dispense Refill  . Bee Pollen 580 MG CAPS Take 1 tablet by mouth 4 (four) times daily.     . metFORMIN (GLUCOPHAGE) 500 MG tablet Take 1 tablet (500 mg total) by mouth daily. 30 tablet 5   No facility-administered medications prior to visit.     Review of  Systems:  Patient denies headache, fevers, malaise, unintentional weight loss, skin rash, eye pain, sinus congestion and sinus pain, sore throat, dysphagia,  hemoptysis , cough, dyspnea, wheezing, chest pain, palpitations, orthopnea, edema, abdominal pain, nausea, melena, diarrhea, constipation, flank pain, dysuria, hematuria, urinary  Frequency, nocturia, numbness, tingling, seizures,  Focal weakness, Loss of consciousness,  Tremor, insomnia, depression, anxiety, and suicidal ideation.     Objective:  BP (!) 158/94   Pulse 64   Temp 97.6 F (36.4 C) (Oral)   Resp 12   Ht 5\' 5"  (1.651 m)   Wt 166 lb 8 oz (75.5 kg)   SpO2 98%   BMI 27.71 kg/m   Physical Exam:  General appearance: alert, cooperative and appears stated age Ears: normal TM's and external ear canals both ears Throat: lips, mucosa, and tongue normal; teeth and gums normal Neck: no adenopathy, no carotid bruit, supple, symmetrical, trachea midline and thyroid not enlarged, symmetric, no tenderness/mass/nodules Back: symmetric, no curvature. ROM normal. No CVA tenderness. Lungs: clear to auscultation bilaterally Heart: regular rate and rhythm, S1, S2 normal, no murmur, click, rub or gallop Abdomen: soft, non-tender; bowel sounds normal; no masses,  no organomegaly Pulses: 2+ and symmetric Skin: Skin color, texture, turgor normal. No rashes or lesions Lymph nodes: Cervical, supraclavicular, and axillary nodes normal. Foot exam:  Nails are well trimmed,  No callouses,  Sensation intact to microfilament  Assessment & Plan:   Problem List Items Addressed This Visit    Blood pressure elevated without  history of HTN    Discussed new goal of 120/70 per ACC guidelines.  She has no history of hypertension.  She has been asked to check her pressures at home and submit readings for evaluation. Renal function is normal.  Lab Results  Component Value Date   CREATININE 0.82 06/05/2016   Lab Results  Component Value Date   NA  140 06/05/2016   K 4.6 06/05/2016   CL 101 06/05/2016   CO2 31 06/05/2016         Diabetes mellitus type 2, controlled, without complications (HCC)    Diagnosed last year  With no follow up until today.  Has been controlled on diet alone . Marland Kitchen. Patient is not up-to-date on eye exams; foot exam is normal today. Patient has no microalbuminuria.   Lab Results  Component Value Date   HGBA1C 6.5 06/05/2016   Lab Results  Component Value Date   MICROALBUR 0.7 06/05/2016         Relevant Orders   Comprehensive metabolic panel (Completed)   Hemoglobin A1c (Completed)   Microalbumin / creatinine urine ratio (Completed)   HLD (hyperlipidemia)   Relevant Orders   LDL cholesterol, direct (Completed)   Lipid panel (Completed)   Hyperlipidemia due to type 2 diabetes mellitus (HCC)    Using the Framingham risk calculator,  her 10 year risk of coronary artery disease is 29%. Will recommend starting aspirin and statin .   Lab Results  Component Value Date   CHOL 238 (H) 06/05/2016   HDL 52.10 06/05/2016   LDLCALC 150 (H) 06/05/2016   LDLDIRECT 171.0 06/05/2016   TRIG 182.0 (H) 06/05/2016   CHOLHDL 5 06/05/2016         Overweight (BMI 25.0-29.9)    I have addressed  BMI and recommended a low glycemic index diet utilizing smaller more frequent meals to increase metabolism.  I have also recommended that patient start exercising with a goal of 30 minutes of aerobic exercise a minimum of 5 days per week.        Other Visit Diagnoses    Breast cancer screening, high risk patient    -  Primary   Relevant Orders   MM SCREENING BREAST TOMO BILATERAL   Vitamin D deficiency       Relevant Orders   VITAMIN D 25 Hydroxy (Vit-D Deficiency, Fractures) (Completed)      I am having Karen Burgess maintain her metFORMIN, Bee Pollen, Loratadine, acetaminophen, and guaiFENesin.  Meds ordered this encounter  Medications  . Loratadine 10 MG CAPS    Sig: Take 1 capsule by mouth as needed.  Marland Kitchen.  acetaminophen (TYLENOL) 500 MG tablet    Sig: Take 500 mg by mouth every 6 (six) hours as needed.  Marland Kitchen. guaiFENesin (MUCINEX) 600 MG 12 hr tablet    Sig: Take 600 mg by mouth 2 (two) times daily.   A total of 45 minutes was spent with patient more than half of which was spent in counseling patient on the above mentioned issues , reviewing and explaining recent labs and imaging studies done, and coordination of care.  There are no discontinued medications.  Follow-up: Return in about 3 months (around 09/03/2016), or cpe plus pap 90+ days from today , for follow up diabetes.   Sherlene ShamsULLO, Katalin Colledge L, MD

## 2016-06-05 NOTE — Progress Notes (Signed)
Pre-visit discussion using our clinic review tool. No additional management support is needed unless otherwise documented below in the visit note.  

## 2016-06-07 ENCOUNTER — Encounter: Payer: BLUE CROSS/BLUE SHIELD | Admitting: Family Medicine

## 2016-06-08 NOTE — Assessment & Plan Note (Signed)
I have addressed  BMI and recommended a low glycemic index diet utilizing smaller more frequent meals to increase metabolism.  I have also recommended that patient start exercising with a goal of 30 minutes of aerobic exercise a minimum of 5 days per week.  

## 2016-06-08 NOTE — Assessment & Plan Note (Signed)
Diagnosed last year  With no follow up until today.  Has been controlled on diet alone . Marland Kitchen. Patient is not up-to-date on eye exams; foot exam is normal today. Patient has no microalbuminuria.   Lab Results  Component Value Date   HGBA1C 6.5 06/05/2016   Lab Results  Component Value Date   MICROALBUR 0.7 06/05/2016

## 2016-06-08 NOTE — Assessment & Plan Note (Signed)
Using the Framingham risk calculator,  her 10 year risk of coronary artery disease is 29%. Will recommend starting aspirin and statin .   Lab Results  Component Value Date   CHOL 238 (H) 06/05/2016   HDL 52.10 06/05/2016   LDLCALC 150 (H) 06/05/2016   LDLDIRECT 171.0 06/05/2016   TRIG 182.0 (H) 06/05/2016   CHOLHDL 5 06/05/2016

## 2016-06-08 NOTE — Assessment & Plan Note (Signed)
Discussed new goal of 120/70 per ACC guidelines.  She has no history of hypertension.  She has been asked to check her pressures at home and submit readings for evaluation. Renal function is normal.  Lab Results  Component Value Date   CREATININE 0.82 06/05/2016   Lab Results  Component Value Date   NA 140 06/05/2016   K 4.6 06/05/2016   CL 101 06/05/2016   CO2 31 06/05/2016

## 2016-07-12 ENCOUNTER — Ambulatory Visit
Admission: RE | Admit: 2016-07-12 | Discharge: 2016-07-12 | Disposition: A | Discharge status: Home / Self Care | Payer: BLUE CROSS/BLUE SHIELD | Source: Ambulatory Visit | Attending: Internal Medicine | Admitting: Internal Medicine

## 2016-07-12 DIAGNOSIS — Z1231 Encounter for screening mammogram for malignant neoplasm of breast: Secondary | ICD-10-CM | POA: Diagnosis not present

## 2016-07-12 DIAGNOSIS — Z1239 Encounter for other screening for malignant neoplasm of breast: Secondary | ICD-10-CM

## 2016-08-05 LAB — HM DIABETES EYE EXAM

## 2016-09-04 ENCOUNTER — Other Ambulatory Visit (HOSPITAL_COMMUNITY)
Admission: RE | Admit: 2016-09-04 | Discharge: 2016-09-04 | Disposition: A | Discharge status: Home / Self Care | Payer: BLUE CROSS/BLUE SHIELD | Source: Ambulatory Visit | Attending: Internal Medicine | Admitting: Internal Medicine

## 2016-09-04 ENCOUNTER — Encounter: Payer: Self-pay | Admitting: Internal Medicine

## 2016-09-04 ENCOUNTER — Ambulatory Visit (INDEPENDENT_AMBULATORY_CARE_PROVIDER_SITE_OTHER): Payer: BLUE CROSS/BLUE SHIELD | Admitting: Internal Medicine

## 2016-09-04 VITALS — BP 130/88 | HR 81 | Resp 15 | Ht 64.75 in | Wt 170.0 lb

## 2016-09-04 DIAGNOSIS — E785 Hyperlipidemia, unspecified: Secondary | ICD-10-CM

## 2016-09-04 DIAGNOSIS — Z1151 Encounter for screening for human papillomavirus (HPV): Secondary | ICD-10-CM | POA: Diagnosis not present

## 2016-09-04 DIAGNOSIS — E1169 Type 2 diabetes mellitus with other specified complication: Secondary | ICD-10-CM

## 2016-09-04 DIAGNOSIS — I10 Essential (primary) hypertension: Secondary | ICD-10-CM | POA: Diagnosis not present

## 2016-09-04 DIAGNOSIS — Z01419 Encounter for gynecological examination (general) (routine) without abnormal findings: Secondary | ICD-10-CM | POA: Insufficient documentation

## 2016-09-04 DIAGNOSIS — Z0000 Encounter for general adult medical examination without abnormal findings: Secondary | ICD-10-CM

## 2016-09-04 DIAGNOSIS — E119 Type 2 diabetes mellitus without complications: Secondary | ICD-10-CM | POA: Diagnosis not present

## 2016-09-04 DIAGNOSIS — E663 Overweight: Secondary | ICD-10-CM | POA: Diagnosis not present

## 2016-09-04 DIAGNOSIS — Z124 Encounter for screening for malignant neoplasm of cervix: Secondary | ICD-10-CM

## 2016-09-04 LAB — COMPREHENSIVE METABOLIC PANEL
ALT: 22 U/L (ref 0–35)
AST: 17 U/L (ref 0–37)
Albumin: 4.5 g/dL (ref 3.5–5.2)
Alkaline Phosphatase: 65 U/L (ref 39–117)
BUN: 14 mg/dL (ref 6–23)
CHLORIDE: 100 meq/L (ref 96–112)
CO2: 31 mEq/L (ref 19–32)
CREATININE: 0.87 mg/dL (ref 0.40–1.20)
Calcium: 9.9 mg/dL (ref 8.4–10.5)
GFR: 70 mL/min (ref >60.00)
GLUCOSE: 105 mg/dL — AB (ref 70–99)
Potassium: 4.2 mEq/L (ref 3.5–5.1)
SODIUM: 138 meq/L (ref 135–145)
Total Bilirubin: 0.3 mg/dL (ref 0.2–1.2)
Total Protein: 7.7 g/dL (ref 6.0–8.3)

## 2016-09-04 LAB — LIPID PANEL
CHOLESTEROL: 235 mg/dL — AB (ref 0–200)
HDL: 54.1 mg/dL (ref >39.00)
LDL CALC: 144 mg/dL — AB (ref 0–99)
NonHDL: 180.55
Total CHOL/HDL Ratio: 4
Triglycerides: 182 mg/dL — ABNORMAL HIGH (ref 0.0–149.0)
VLDL: 36.4 mg/dL (ref 0.0–40.0)

## 2016-09-04 LAB — POCT GLYCOSYLATED HEMOGLOBIN (HGB A1C): HEMOGLOBIN A1C: 6

## 2016-09-04 MED ORDER — LIRAGLUTIDE -WEIGHT MANAGEMENT 18 MG/3ML ~~LOC~~ SOPN: 0.6000 mg | PEN_INJECTOR | Freq: Every day | SUBCUTANEOUS | 0 refills | Status: DC

## 2016-09-04 MED ORDER — LOSARTAN POTASSIUM-HCTZ 50-12.5 MG PO TABS: 1.0000 | ORAL_TABLET | Freq: Every day | ORAL | 0 refills | Status: DC

## 2016-09-04 NOTE — Progress Notes (Signed)
Patient ID: Karen Burgess, female    DOB: [birth year]  Age: 61 y.o. MRN: [id number]  The patient is here for annual  examination and management of type 2 dm and hyperlipidemia and hypertension.  Eye exam normal feb 2018. Mammogram  normal jan 2018   The risk factors are reflected in the social history.  The roster of all physicians providing medical care to patient - is listed in the Snapshot section of the chart.   Home safety : The patient has smoke detectors in the home. They wear seatbelts.  There are secured firearms at home. There is no violence in the home.   There is no risks for hepatitis, STDs or HIV. There is no   history of blood transfusion. They have no travel history to infectious disease endemic areas of the world.  The patient has seen their dentist in the last six month. They have seen their eye doctor in the last year.    They do not  have excessive sun exposure. Discussed the need for sun protection: hats, long sleeves and use of sunscreen if there is significant sun exposure.   Diet: the importance of a healthy diet is discussed. They do have a healthy diet.  The benefits of regular aerobic exercise were discussed. She walks 4 times per week ,  20 minutes.   Depression screen: there are no signs or vegative symptoms of depression- irritability, change in appetite, anhedonia, sadness/tearfullness.  The following portions of the patient's history were reviewed and updated as appropriate: allergies, current medications, past family history, past medical history,  past surgical history, past social history  and problem list.  Visual acuity was not assessed per patient preference since she has regular follow up with her ophthalmologist. Hearing and body mass index were assessed and reviewed.   During the course of the visit the patient was educated and counseled about appropriate screening and preventive services including : fall prevention , diabetes screening,  nutrition counseling, colorectal cancer screening, and recommended immunizations.    CC: The primary encounter diagnosis was Diabetes mellitus without complication (HCC). Diagnoses of Cervical cancer screening, Essential hypertension, Controlled type 2 diabetes mellitus without complication, without long-term current use of insulin (HCC), Hyperlipidemia due to type 2 diabetes mellitus (HCC), Overweight (BMI 25.0-29.9), Encounter for preventive health examination, and Pap smear for cervical cancer screening were also pertinent to this visit.  3 month follow up on diabetes.  Patient has no complaints today.  Patient is following a low glycemic index diet and taking all prescribed medications regularly without side effects.  Fasting sugars have been under less than 140 most of the time and post prandials have been under 160 except on rare occasions. Patient is exercising about 3 times per week and intentionally trying to lose weight .  Patient has had an eye exam in the last 12 months and checks feet regularly for signs of infection.  Patient does not walk barefoot outside,  And denies an numbness tingling or burning in feet. Patient is not up to date on all recommended vaccinations   History Karen Burgess has a past medical history of Allergy; Diabetes mellitus without complication (HCC); and Hyperlipidemia.   She has no past surgical history on file.   Her family history includes Breast cancer in her cousin; Breast cancer (age of onset: 36) in her sister; Breast cancer (age of onset: 83) in her mother; Diabetes Mellitus I in her paternal aunt; Prostate cancer in her father.She reports that she quit  smoking about 32 years ago. Her smoking use included Cigarettes. She has never used smokeless tobacco. She reports that she drinks alcohol. She reports that she does not use drugs.  Outpatient Medications Prior to Visit  Medication Sig Dispense Refill  . acetaminophen (TYLENOL) 500 MG tablet Take 500 mg by mouth  every 6 (six) hours as needed.    Alphonsus Sias Pollen 580 MG CAPS Take 1 tablet by mouth 4 (four) times daily.     Marland Kitchen guaiFENesin (MUCINEX) 600 MG 12 hr tablet Take 600 mg by mouth 2 (two) times daily.    . Loratadine 10 MG CAPS Take 1 capsule by mouth as needed.    . metFORMIN (GLUCOPHAGE) 500 MG tablet Take 1 tablet (500 mg total) by mouth daily. 30 tablet 5   No facility-administered medications prior to visit.     Review of Systems   Patient denies headache, fevers, malaise, unintentional weight loss, skin rash, eye pain, sinus congestion and sinus pain, sore throat, dysphagia,  hemoptysis , cough, dyspnea, wheezing, chest pain, palpitations, orthopnea, edema, abdominal pain, nausea, melena, diarrhea, constipation, flank pain, dysuria, hematuria, urinary  Frequency, nocturia, numbness, tingling, seizures,  Focal weakness, Loss of consciousness,  Tremor, insomnia, depression, anxiety, and suicidal ideation.      Objective:  BP 130/88 (BP Location: Left Arm, Patient Position: Sitting, Cuff Size: Normal)   Pulse 81   Resp 15   Ht 5' 4.75" (1.645 m)   Wt 170 lb (77.1 kg)   SpO2 94%   BMI 28.51 kg/m   Physical Exam   General Appearance:    Alert, cooperative, no distress, appears stated age  Head:    Normocephalic, without obvious abnormality, atraumatic  Eyes:    PERRL, conjunctiva/corneas clear, EOM's intact, fundi    benign, both eyes  Ears:    Normal TM's and external ear canals, both ears  Nose:   Nares normal, septum midline, mucosa normal, no drainage    or sinus tenderness  Throat:   Lips, mucosa, and tongue normal; teeth and gums normal  Neck:   Supple, symmetrical, trachea midline, no adenopathy;    thyroid:  no enlargement/tenderness/nodules; no carotid   bruit or JVD  Back:     Symmetric, no curvature, ROM normal, no CVA tenderness  Lungs:     Clear to auscultation bilaterally, respirations unlabored  Chest Wall:    No tenderness or deformity   Heart:    Regular rate and  rhythm, S1 and S2 normal, no murmur, rub   or gallop  Breast Exam:    No tenderness, masses, or nipple abnormality  Abdomen:     Soft, non-tender, bowel sounds active all four quadrants,    no masses, no organomegaly  Genitalia:    Pelvic: cervix normal in appearance, external genitalia normal, no adnexal masses or tenderness, no cervical motion tenderness, rectovaginal septum normal, uterus normal size, shape, and consistency and vagina normal without discharge  Extremities:   Extremities normal, atraumatic, no cyanosis or edema  Pulses:   2+ and symmetric all extremities  Skin:   Skin color, texture, turgor normal, no rashes or lesions  Lymph nodes:   Cervical, supraclavicular, and axillary nodes normal  Neurologic:   CNII-XII intact, normal strength, sensation and reflexes    throughout        Assessment & Plan:   Problem List Items Addressed This Visit    Diabetes mellitus type 2, controlled, without complications (HCC)    Diagnosed last year  With  no follow up until today.  remains well  controlled on diet alone . Marland Kitchen. Patient is not up-to-date on eye exams; foot exam is normal today. Patient has no microalbuminuria.   Lab Results  Component Value Date   HGBA1C 6.0 09/04/2016   Lab Results  Component Value Date   MICROALBUR 0.7 06/05/2016         Relevant Medications   losartan-hydrochlorothiazide (HYZAAR) 50-12.5 MG tablet   Encounter for preventive health examination    Annual comprehensive preventive exam was done as well as an evaluation and management of chronic conditions .  During the course of the visit the patient was educated and counseled about appropriate screening and preventive services including :  diabetes screening, lipid analysis with projected  10 year  risk for CAD , nutrition counseling, breast, cervical and colorectal cancer screening, and recommended immunizations.  Printed recommendations for health maintenance screenings was given.  PAP smear was done  today      Essential hypertension    Starting losartan given concurrent history of type 2 dm.  Patient will delay start until she can rertrun one week later for bmet and bp check  Lab Results  Component Value Date   CREATININE 0.87 09/04/2016   Lab Results  Component Value Date   NA 138 09/04/2016   K 4.2 09/04/2016   CL 100 09/04/2016   CO2 31 09/04/2016   Lab Results  Component Value Date   MICROALBUR 0.7 06/05/2016         Relevant Medications   losartan-hydrochlorothiazide (HYZAAR) 50-12.5 MG tablet   Hyperlipidemia due to type 2 diabetes mellitus (HCC)    Improved with diet.  Using the Framingham risk calculator,  her 10 year risk of coronary artery disease is now lower but still elevated at 17%, . I have l recommended starting aspirin and statin .   Lab Results  Component Value Date   CHOL 235 (H) 09/04/2016   HDL 54.10 09/04/2016   LDLCALC 144 (H) 09/04/2016   LDLDIRECT 171.0 06/05/2016   TRIG 182.0 (H) 09/04/2016   CHOLHDL 4 09/04/2016         Relevant Medications   losartan-hydrochlorothiazide (HYZAAR) 50-12.5 MG tablet   Overweight (BMI 25.0-29.9)    I have addressed  BMI and recommended a low glycemic index diet utilizing smaller more frequent meals to increase metabolism.  I have also recommended that patient start exercising with a goal of 30 minutes of aerobic exercise a minimum of 5 days per week. I have also recommended pharmacotherapy with Saxenda        Pap smear for cervical cancer screening    Done today . 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 I recommend repeating the PAP in one year       Other Visit Diagnoses    Diabetes mellitus without complication (HCC)    -  Primary   Relevant Medications   losartan-hydrochlorothiazide (HYZAAR) 50-12.5 MG tablet   Other Relevant Orders   POCT HgB A1C (Completed)   Comprehensive metabolic panel (Completed)   Lipid panel (Completed)   Cervical cancer  screening       Relevant Orders   Cytology - PAP (Completed)      I am having Ms. Vitali start on losartan-hydrochlorothiazide and Liraglutide -Weight Management. I am also having her maintain her metFORMIN, Bee Pollen, Loratadine, acetaminophen, guaiFENesin, and cholecalciferol.  Meds ordered this encounter  Medications  . cholecalciferol (VITAMIN D) 1000  units tablet    Sig: Take 1,000 Units by mouth daily.  Marland Kitchen losartan-hydrochlorothiazide (HYZAAR) 50-12.5 MG tablet    Sig: Take 1 tablet by mouth daily.    Dispense:  30 tablet    Refill:  0  . Liraglutide -Weight Management (SAXENDA) 18 MG/3ML SOPN    Sig: Inject 0.6 mg into the skin daily. Increase dose weekly as follows: Week 2: 1.2 mg daily ; Week 3: 1.8 mg daily; Week 4: 2.4 mg daily    Dispense:  9 mL    Refill:  0    There are no discontinued medications.  Follow-up: No Follow-up on file.   Sherlene Shams, MD

## 2016-09-04 NOTE — Progress Notes (Signed)
Pre visit review using our clinic review tool, if applicable. No additional management support is needed unless otherwise documented below in the visit note. 

## 2016-09-04 NOTE — Patient Instructions (Addendum)
We are starting LOSARTAN FOR BLOOD PRESSURE,  DO NOT START UNTIL YOU ARE CONFIDENT YOU CAN RETURN AFTER ONE WEEK FOR A  NONFASTING LAB  CHeck  The natural remedies for cholesterol have not been proven to reduce your risk for a heart attack.  Red Yeast Rice has not been proven either,  But does lower cholesterol, so if you want to try it , the dose is 600 mg twice daily in capsule form, available OTC.   My trainer is [employer] Rx[employer]le Crump  .  She works at [employer]  But also has on line clients [phone] (cell)   I am recommending use of the medication called Saxenda to help you lose weight.  It is similar to a a medicine that is used to treat diabetes called Victoza,  So It may lower your blood sugars .   It is injected daily in incrementally increasing doses (if tolerated,  Nausea usually resolves in a few days)"  0.6 mg daily   Week 1 1.2 mg daily Week 2 1.8 mg  Daly Week 3 2.4 mg daily Week 4 3.0 mg daily Week 5 and ongoing   If you want to bring the pen with you to be shown how to give yourself the dose,  We wil make you an  RN visit once you pick up your medication from your pharmacy     The  diet I discussed with you today is the 10 day Green Smoothie Cleansing /Detox Diet by Brooke DareJJ Colello . available on Amazon for around $10.  It does require a blender, (Vita Mix, a electric juicer,  Or a Nutribullet Rx).  This is not a low carb or a weight loss diet,  It is fundamentally a "cleansing" low fat diet that eliminates sugar, gluten, caffeine, alcohol and dairy for 10 days .  What you add back after the initial ten days is entirely up to  you!  You can expect to lose 5 to 10 lbs depending on how strict you are.   I found that  drinking 2 smoothies or juices  daily and keeping one chewable meal (but keep it simple, like baked fish and salad, rice or bok choy) kept me satisfied and kept me from straying  .  You snack primarily on fresh  fruit, egg whites and judicious quantities of nuts.  You can  add a  vegetable based protein powder  to any smoothie made with almond milk (nothing with whey , since whey is dairy)  [employer] has a few but  the [employer] has the greatest  selection .  Using frozen fruits is much more convenient and cost effective. You can even find plenty of organic fruit in the frozen fruit section of [employer].  Just thaw what you need for the following day the night before in the refrigerator (to avoid jamming up your machine)   The organic vegan protein powder I tried  is called Vega" and I found it at [employer] .  It is sugar free. Tastes like crap.  My advice:  Dorna BloomChew your protein  (eat an egg or two in the am with your smoothie or add soy yogurt for protein ) ,  Don't ruin the taste of your smoothies with protein powder unless you can find one you really love.

## 2016-09-06 LAB — CYTOLOGY - PAP
Diagnosis: NEGATIVE
HPV: NOT DETECTED

## 2016-09-07 NOTE — Assessment & Plan Note (Addendum)
I have addressed  BMI and recommended a low glycemic index diet utilizing smaller more frequent meals to increase metabolism.  I have also recommended that patient start exercising with a goal of 30 minutes of aerobic exercise a minimum of 5 days per week. I have also recommended pharmacotherapy with Saxenda

## 2016-09-07 NOTE — Assessment & Plan Note (Signed)
Annual comprehensive preventive exam was done as well as an evaluation and management of chronic conditions .  During the course of the visit the patient was educated and counseled about appropriate screening and preventive services including :  diabetes screening, lipid analysis with projected  10 year  risk for CAD , nutrition counseling, breast, cervical and colorectal cancer screening, and recommended immunizations.  Printed recommendations for health maintenance screenings was given.  PAP smear was done today  

## 2016-09-07 NOTE — Assessment & Plan Note (Addendum)
Improved with diet.  Using the Framingham risk calculator,  her 10 year risk of coronary artery disease is now lower but still elevated at 17%, . I have l recommended starting aspirin and statin .   Lab Results  Component Value Date   CHOL 235 (H) 09/04/2016   HDL 54.10 09/04/2016   LDLCALC 144 (H) 09/04/2016   LDLDIRECT 171.0 06/05/2016   TRIG 182.0 (H) 09/04/2016   CHOLHDL 4 09/04/2016

## 2016-09-07 NOTE — Assessment & Plan Note (Signed)
Diagnosed last year  With no follow up until today.  remains well  controlled on diet alone . Marland Kitchen. Patient is not up-to-date on eye exams; foot exam is normal today. Patient has no microalbuminuria.   Lab Results  Component Value Date   HGBA1C 6.0 09/04/2016   Lab Results  Component Value Date   MICROALBUR 0.7 06/05/2016

## 2016-09-07 NOTE — Assessment & Plan Note (Signed)
Done today . 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 I recommend repeating the PAP in one year

## 2016-09-07 NOTE — Assessment & Plan Note (Signed)
Starting losartan given concurrent history of type 2 dm.  Patient will delay start until she can rertrun one week later for bmet and bp check  Lab Results  Component Value Date   CREATININE 0.87 09/04/2016   Lab Results  Component Value Date   NA 138 09/04/2016   K 4.2 09/04/2016   CL 100 09/04/2016   CO2 31 09/04/2016   Lab Results  Component Value Date   MICROALBUR 0.7 06/05/2016

## 2016-09-08 ENCOUNTER — Encounter: Payer: Self-pay | Admitting: Internal Medicine

## 2016-09-11 ENCOUNTER — Other Ambulatory Visit: Payer: BLUE CROSS/BLUE SHIELD

## 2016-09-11 NOTE — Progress Notes (Signed)
Prior authorization for Karen [facility]oveySaxenda [facility] Awaiting decision.

## 2016-09-12 ENCOUNTER — Telehealth: Payer: Self-pay | Admitting: Radiology

## 2016-09-12 NOTE — Telephone Encounter (Addendum)
She had fasting labs on March 18th and mychart message sent but not read yet.   Please call patient she does not need more at this time .

## 2016-09-12 NOTE — Telephone Encounter (Signed)
Can you call pt and explain she does not need her lab appt tomorrow per Dr. Darrick Huntsmanullo? Pt had labs on March 18th and does need to keep appt.

## 2016-09-12 NOTE — Telephone Encounter (Signed)
Pt coming in for labs tomorrow, please place future orders. Thank you.  

## 2016-09-13 ENCOUNTER — Other Ambulatory Visit: Payer: BLUE CROSS/BLUE SHIELD

## 2016-09-13 ENCOUNTER — Telehealth: Payer: Self-pay

## 2016-09-13 NOTE — Telephone Encounter (Signed)
Received fax from pt's insurance card stating that they have denied the saxenda because the pt has not tried and failed or not tolerated both Belviq and Qsymia.

## 2016-09-16 NOTE — Telephone Encounter (Signed)
Mychart message sent.

## 2016-09-26 ENCOUNTER — Telehealth: Payer: Self-pay

## 2016-09-26 NOTE — Telephone Encounter (Signed)
Received a letter from [facility] stating that they have denied Saxenda because the pt has not tried and failed both belviq and qsymia. Paperwork has been placed in your red folder.

## 2016-10-05 ENCOUNTER — Other Ambulatory Visit: Payer: Self-pay | Admitting: Internal Medicine

## 2016-11-05 ENCOUNTER — Other Ambulatory Visit: Payer: Self-pay | Admitting: Internal Medicine

## 2016-12-03 ENCOUNTER — Other Ambulatory Visit: Payer: Self-pay | Admitting: Internal Medicine

## 2017-01-01 ENCOUNTER — Other Ambulatory Visit: Payer: Self-pay

## 2017-01-01 MED ORDER — LOSARTAN POTASSIUM-HCTZ 50-12.5 MG PO TABS: 1.0000 | ORAL_TABLET | Freq: Every day | ORAL | 0 refills | Status: DC

## 2017-03-03 ENCOUNTER — Telehealth: Payer: BLUE CROSS/BLUE SHIELD | Admitting: Family

## 2017-03-03 DIAGNOSIS — N390 Urinary tract infection, site not specified: Secondary | ICD-10-CM

## 2017-03-03 MED ORDER — NITROFURANTOIN MONOHYD MACRO 100 MG PO CAPS: 100.0000 mg | ORAL_CAPSULE | Freq: Two times a day (BID) | ORAL | 0 refills | Status: DC

## 2017-03-03 NOTE — Progress Notes (Signed)
Thank you for the details you put in the comment boxes. Those details really help us take better care of you.   We are sorry that you are not feeling well.  Here is how we plan to help!  Based on what you shared with me it looks like you most likely have a simple urinary tract infection.  A UTI (Urinary Tract Infection) is a bacterial infection of the bladder.  Most cases of urinary tract infections are simple to treat but a key part of your care is to encourage you to drink plenty of fluids and watch your symptoms carefully.  I have prescribed MacroBid 100 mg twice a day for 5 days.  Your symptoms should gradually improve. Call us if the burning in your urine worsens, you develop worsening fever, back pain or pelvic pain or if your symptoms do not resolve after completing the antibiotic.  Urinary tract infections can be prevented by drinking plenty of water to keep your body hydrated.  Also be sure when you wipe, wipe from front to back and don't hold it in!  If possible, empty your bladder every 4 hours.  Your e-visit answers were reviewed by a board certified advanced clinical practitioner to complete your personal care plan.  Depending on the condition, your plan could have included both over the counter or prescription medications.  If there is a problem please reply  once you have received a response from your provider.  Your safety is important to us.  If you have drug allergies check your prescription carefully.    You can use MyChart to ask questions about today's visit, request a non-urgent call back, or ask for a work or school excuse for 24 hours related to this e-Visit. If it has been greater than 24 hours you will need to follow up with your provider, or enter a new e-Visit to address those concerns.   You will get an e-mail in the next two days asking about your experience.  I hope that your e-visit has been valuable and will speed your recovery. Thank you for using  e-visits.    

## 2017-04-04 ENCOUNTER — Telehealth: Payer: Self-pay | Admitting: Internal Medicine

## 2017-04-04 MED ORDER — LOSARTAN POTASSIUM-HCTZ 50-12.5 MG PO TABS: 1.0000 | ORAL_TABLET | Freq: Every day | ORAL | 1 refills | Status: DC

## 2017-04-04 NOTE — Telephone Encounter (Signed)
Pt is requesting a 90 day refill on her losartan-hydrochlorothiazide (HYZAAR) 50-12.5 MG tablet. Pharmacy is [facility], in [place]. Pt states she is out of medication as of today.

## 2017-04-04 NOTE — Telephone Encounter (Signed)
Rx was sent to [facility].

## 2017-06-09 ENCOUNTER — Other Ambulatory Visit: Payer: Self-pay | Admitting: Internal Medicine

## 2017-06-09 DIAGNOSIS — Z1231 Encounter for screening mammogram for malignant neoplasm of breast: Secondary | ICD-10-CM

## 2017-07-17 ENCOUNTER — Ambulatory Visit
Admission: RE | Admit: 2017-07-17 | Discharge: 2017-07-17 | Disposition: A | Discharge status: Home / Self Care | Payer: Self-pay | Source: Ambulatory Visit | Attending: Internal Medicine | Admitting: Internal Medicine

## 2017-07-17 DIAGNOSIS — Z1231 Encounter for screening mammogram for malignant neoplasm of breast: Secondary | ICD-10-CM | POA: Insufficient documentation

## 2017-07-17 HISTORY — DX: Essential (primary) hypertension: I10

## 2018-07-01 ENCOUNTER — Other Ambulatory Visit: Payer: Self-pay | Admitting: Internal Medicine

## 2018-07-01 DIAGNOSIS — Z1231 Encounter for screening mammogram for malignant neoplasm of breast: Secondary | ICD-10-CM

## 2018-07-20 ENCOUNTER — Ambulatory Visit
Admission: RE | Admit: 2018-07-20 | Discharge: 2018-07-20 | Disposition: A | Discharge status: Home / Self Care | Payer: Self-pay | Source: Ambulatory Visit | Attending: Internal Medicine | Admitting: Internal Medicine

## 2018-07-20 DIAGNOSIS — Z1231 Encounter for screening mammogram for malignant neoplasm of breast: Secondary | ICD-10-CM | POA: Insufficient documentation

## 2018-12-16 ENCOUNTER — Encounter: Payer: Self-pay | Admitting: Internal Medicine

## 2019-03-03 ENCOUNTER — Encounter: Payer: Self-pay | Admitting: Internal Medicine

## 2019-04-05 DIAGNOSIS — Z0120 Encounter for dental examination and cleaning without abnormal findings: Secondary | ICD-10-CM | POA: Diagnosis not present

## 2019-04-06 ENCOUNTER — Encounter: Payer: Self-pay | Admitting: Internal Medicine

## 2019-04-06 ENCOUNTER — Ambulatory Visit (INDEPENDENT_AMBULATORY_CARE_PROVIDER_SITE_OTHER): Payer: Medicare Other | Admitting: Internal Medicine

## 2019-04-06 ENCOUNTER — Other Ambulatory Visit: Payer: Self-pay

## 2019-04-06 VITALS — BP 148/88 | HR 80 | Temp 97.4°F | Resp 15 | Ht 64.75 in | Wt 162.0 lb

## 2019-04-06 DIAGNOSIS — R002 Palpitations: Secondary | ICD-10-CM

## 2019-04-06 DIAGNOSIS — Z23 Encounter for immunization: Secondary | ICD-10-CM

## 2019-04-06 DIAGNOSIS — Z0000 Encounter for general adult medical examination without abnormal findings: Secondary | ICD-10-CM | POA: Diagnosis not present

## 2019-04-06 DIAGNOSIS — E119 Type 2 diabetes mellitus without complications: Secondary | ICD-10-CM

## 2019-04-06 DIAGNOSIS — E559 Vitamin D deficiency, unspecified: Secondary | ICD-10-CM | POA: Diagnosis not present

## 2019-04-06 DIAGNOSIS — E785 Hyperlipidemia, unspecified: Secondary | ICD-10-CM

## 2019-04-06 DIAGNOSIS — I10 Essential (primary) hypertension: Secondary | ICD-10-CM

## 2019-04-06 DIAGNOSIS — E1169 Type 2 diabetes mellitus with other specified complication: Secondary | ICD-10-CM

## 2019-04-06 MED ORDER — ZOSTER VAC RECOMB ADJUVANTED 50 MCG/0.5ML IM SUSR: 0.5000 mL | Freq: Once | INTRAMUSCULAR | 1 refills | Status: AC

## 2019-04-06 NOTE — Assessment & Plan Note (Addendum)
Diagnosed  In 2018  With patient deferring biannual follow up.  . Patient is not up-to-date on eye exams; foot exam is normal today. Patient has no history of  microalbuminuria.   Lab Results  Component Value Date   HGBA1C 6.0 09/04/2016   Lab Results  Component Value Date   MICROALBUR 0.7 06/05/2016

## 2019-04-06 NOTE — Assessment & Plan Note (Signed)
Improved with diet.  Using the Framingham risk calculator,  her 10 year risk of coronary artery disease is now lower but still elevated at 17%, . I have l recommended starting aspirin and statin  But she has deferred.. repeat lipids are due.   Lab Results  Component Value Date   CHOL 235 (H) 09/04/2016   HDL 54.10 09/04/2016   LDLCALC 144 (H) 09/04/2016   LDLDIRECT 171.0 06/05/2016   TRIG 182.0 (H) 09/04/2016   CHOLHDL 4 09/04/2016

## 2019-04-06 NOTE — Patient Instructions (Signed)
The new goals for optimal blood pressure management are 120/70.  Please check your blood pressure a few times at home and send me the readings so I can determine if you need a change in medication   Your [employer] shake may be the culprit;  Make sure you check BP on a day you have NOT DRANK THE SHAKE  YOU RECEIVED THE PNEUMOVAX VACCINE TODAY .    Prevnar (THE OTHER PNEUMONIA VACCINE) is recommended NEXT YEAR against pneumonia,  followed by YOUR FINAL Pneumovax I 2025.    The ShingRx vaccine is now available in local pharmacies and is much more protective than the old one  Zostavax  (it is about 97%  Effective in preventing shingles). .   It is therefore ADVISED for all interested adults over 50 to prevent shingles so I have printed you a prescription for it.  (it requires a 2nd dose 2 too 6 months after the first one) .  It will cause you to have flu  like symptoms for 2 days   Let me know where you need a mammogram order sent to    Health Maintenance for Postmenopausal Women Menopause is a normal process in which your ability to get pregnant comes to an end. This process happens slowly over many months or years, usually between the ages of 748 and 3655. Menopause is complete when you have missed your menstrual periods for 12 months. It is important to talk with your health care provider about some of the most common conditions that affect women after menopause (postmenopausal women). These include heart disease, cancer, and bone loss (osteoporosis). Adopting a healthy lifestyle and getting preventive care can help to promote your health and wellness. The actions you take can also lower your chances of developing some of these common conditions. What should I know about menopause? During menopause, you may get a number of symptoms, such as:  Hot flashes. These can be moderate or severe.  Night sweats.  Decrease in sex drive.  Mood swings.  Headaches.  Tiredness.  Irritability.   Memory problems.  Insomnia. Choosing to treat or not to treat these symptoms is a decision that you make with your health care provider. Do I need hormone replacement therapy?  Hormone replacement therapy is effective in treating symptoms that are caused by menopause, such as hot flashes and night sweats.  Hormone replacement carries certain risks, especially as you become older. If you are thinking about using estrogen or estrogen with progestin, discuss the benefits and risks with your health care provider. What is my risk for heart disease and stroke? The risk of heart disease, heart attack, and stroke increases as you age. One of the causes may be a change in the body's hormones during menopause. This can affect how your body uses dietary fats, triglycerides, and cholesterol. Heart attack and stroke are medical emergencies. There are many things that you can do to help prevent heart disease and stroke. Watch your blood pressure  High blood pressure causes heart disease and increases the risk of stroke. This is more likely to develop in people who have high blood pressure readings, are of African descent, or are overweight.  Have your blood pressure checked: ? Every 3-5 years if you are 1918-65 years of age. ? Every year if you are 65 years old or older. Eat a healthy diet   Eat a diet that includes plenty of vegetables, fruits, low-fat dairy products, and lean protein.  Do not eat a  lot of foods that are high in solid fats, added sugars, or sodium. Get regular exercise Get regular exercise. This is one of the most important things you can do for your health. Most adults should:  Try to exercise for at least 150 minutes each week. The exercise should increase your heart rate and make you sweat (moderate-intensity exercise).  Try to do strengthening exercises at least twice each week. Do these in addition to the moderate-intensity exercise.  Spend less time sitting. Even light physical  activity can be beneficial. Other tips  Work with your health care provider to achieve or maintain a healthy weight.  Do not use any products that contain nicotine or tobacco, such as cigarettes, e-cigarettes, and chewing tobacco. If you need help quitting, ask your health care provider.  Know your numbers. Ask your health care provider to check your cholesterol and your blood sugar (glucose). Continue to have your blood tested as directed by your health care provider. Do I need screening for cancer? Depending on your health history and family history, you may need to have cancer screening at different stages of your life. This may include screening for:  Breast cancer.  Cervical cancer.  Lung cancer.  Colorectal cancer. What is my risk for osteoporosis? After menopause, you may be at increased risk for osteoporosis. Osteoporosis is a condition in which bone destruction happens more quickly than new bone creation. To help prevent osteoporosis or the bone fractures that can happen because of osteoporosis, you may take the following actions:  If you are 30-40 years old, get at least 1,000 mg of calcium and at least 600 mg of vitamin D per day.  If you are older than age 13 but younger than age 66, get at least 1,200 mg of calcium and at least 600 mg of vitamin D per day.  If you are older than age 51, get at least 1,200 mg of calcium and at least 800 mg of vitamin D per day. Smoking and drinking excessive alcohol increase the risk of osteoporosis. Eat foods that are rich in calcium and vitamin D, and do weight-bearing exercises several times each week as directed by your health care provider. How does menopause affect my mental health? Depression may occur at any age, but it is more common as you become older. Common symptoms of depression include:  Low or sad mood.  Changes in sleep patterns.  Changes in appetite or eating patterns.  Feeling an overall lack of motivation or  enjoyment of activities that you previously enjoyed.  Frequent crying spells. Talk with your health care provider if you think that you are experiencing depression. General instructions See your health care provider for regular wellness exams and vaccines. This may include:  Scheduling regular health, dental, and eye exams.  Getting and maintaining your vaccines. These include: ? Influenza vaccine. Get this vaccine each year before the flu season begins. ? Pneumonia vaccine. ? Shingles vaccine. ? Tetanus, diphtheria, and pertussis (Tdap) booster vaccine. Your health care provider may also recommend other immunizations. Tell your health care provider if you have ever been abused or do not feel safe at home. Summary  Menopause is a normal process in which your ability to get pregnant comes to an end.  This condition causes hot flashes, night sweats, decreased interest in sex, mood swings, headaches, or lack of sleep.  Treatment for this condition may include hormone replacement therapy.  Take actions to keep yourself healthy, including exercising regularly, eating a healthy  diet, watching your weight, and checking your blood pressure and blood sugar levels.  Get screened for cancer and depression. Make sure that you are up to date with all your vaccines. This information is not intended to replace advice given to you by your health care provider. Make sure you discuss any questions you have with your health care provider. Document Released: 08/02/2005 Document Revised: 06/03/2018 Document Reviewed: 06/03/2018 Elsevier Patient Education  2020 [employer].

## 2019-04-06 NOTE — Progress Notes (Signed)
Patient ID: Karen Burgess, female    DOB: [birth year]  Age: 63 y.o. MRN: [id number]  The patient is here for annual preventive  examination and management of other chronic and acute problems.   DELINES FLU VACCINE ANNUALLY PAP SMEAR NORMAL  2018 NO LABS SINCE 2018   The risk factors are reflected in the social history.  The roster of all physicians providing medical care to patient - is listed in the Snapshot section of the chart.  Activities of daily living:  The patient is 100% independent in all ADLs: dressing, toileting, feeding as well as independent mobility  Home safety : The patient has smoke detectors in the home. They wear seatbelts.  There are no firearms at home. There is no violence in the home.   There is no risks for hepatitis, STDs or HIV. There is no   history of blood transfusion. They have no travel history to infectious disease endemic areas of the world.  The patient has seen their dentist in the last six month. They have seen their eye doctor in the last year. They admit to slight hearing difficulty with regard to whispered voices and some television programs.  They have deferred audiologic testing in the last year.  They do not  have excessive sun exposure. Discussed the need for sun protection: hats, long sleeves and use of sunscreen if there is significant sun exposure.   Diet: the importance of a healthy diet is discussed. They do have a healthy diet.  The benefits of regular aerobic exercise were discussed. She walks 4 times per week ,  20 minutes.   Depression screen: there are no signs or vegative symptoms of depression- irritability, change in appetite, anhedonia, sadness/tearfullness.  Cognitive assessment: the patient manages all their financial and personal affairs and is actively engaged. They could relate day,date,year and events; recalled 2/3 objects at 3 minutes; performed clock-face test normally.  The following portions of the patient's history were  reviewed and updated as appropriate: allergies, current medications, past family history, past medical history,  past surgical history, past social history  and problem list.  Visual acuity was not assessed per patient preference since she has regular follow up with her ophthalmologist. Hearing and body mass index were assessed and reviewed.   During the course of the visit the patient was educated and counseled about appropriate screening and preventive services including : fall prevention , diabetes screening, nutrition counseling, colorectal cancer screening, and recommended immunizations.    CC: The primary encounter diagnosis was Controlled type 2 diabetes mellitus without complication, without long-term current use of insulin (HCC). Diagnoses of Palpitations, Vitamin D deficiency, Need for 23-polyvalent pneumococcal polysaccharide vaccine, Routine general medical examination at a health care facility, Hyperlipidemia due to type 2 diabetes mellitus (HCC), and Essential hypertension were also pertinent to this visit.  1) DIET CONTROLLED DM. Patient does not check blood sugars more than once a month,  Last one was 135 a month ago in a fasting state.  Dos not recall any above 200 lr less than 80.  No complaints today.  Taking his medications as directed,  Not exercising on a regular basis or trying to lose weight.  Patient voices awareness  of the foods he/she needs to avoid,  And follows a low GI diet about 50% of the time.  Has not had an annual diabetic eye exam.  Denies numbness and tingling in lower extremities.  Denies hypoglycemic symptoms.   2) LEFT KNEE PAIN for the  past 2-3 years.  Had  A twisting incident ,  X ray was normal except for OA   3) moving to [place] tomorrow!  To be near daughter and new grandchild.   History Karen Burgess has a past medical history of Allergy, Diabetes mellitus without complication ([place]), Essential hypertension, and Hyperlipidemia.   She has no past surgical  history on file.   Her family history includes Breast cancer in her cousin; Breast cancer (age of onset: 72) in her sister; Breast cancer (age of onset: 64) in her mother; Diabetes Mellitus I in her paternal aunt; Prostate cancer in her father.She reports that she quit smoking about 34 years ago. Her smoking use included cigarettes. She has never used smokeless tobacco. She reports current alcohol use. She reports that she does not use drugs.  Outpatient Medications Prior to Visit  Medication Sig Dispense Refill  . acetaminophen (TYLENOL) 500 MG tablet Take 500 mg by mouth every 6 (six) hours as needed.    . Loratadine 10 MG CAPS Take 1 capsule by mouth as needed.    Karen Burgess 580 MG CAPS Take 1 tablet by mouth 4 (four) times daily.     . cholecalciferol (VITAMIN D) 1000 units tablet Take 1,000 Units by mouth daily.    Marland Kitchen guaiFENesin (MUCINEX) 600 MG 12 hr tablet Take 600 mg by mouth 2 (two) times daily.    . Liraglutide -Weight Management (SAXENDA) 18 MG/3ML SOPN Inject 0.6 mg into the skin daily. Increase dose weekly as follows: Week 2: 1.2 mg daily ; Week 3: 1.8 mg daily; Week 4: 2.4 mg daily 9 mL 0  . losartan-hydrochlorothiazide (HYZAAR) 50-12.5 MG tablet Take 1 tablet by mouth daily. (Patient not taking: Reported on 04/06/2019) 90 tablet 1  . metFORMIN (GLUCOPHAGE) 500 MG tablet Take 1 tablet (500 mg total) by mouth daily. (Patient not taking: Reported on 04/06/2019) 30 tablet 5  . nitrofurantoin, macrocrystal-monohydrate, (MACROBID) 100 MG capsule Take 1 capsule (100 mg total) by mouth 2 (two) times daily. (Patient not taking: Reported on 04/06/2019) 10 capsule 0   No facility-administered medications prior to visit.     Review of Systems  Patient denies headache, fevers, malaise, unintentional weight loss, skin rash, eye pain, sinus congestion and sinus pain, sore throat, dysphagia,  hemoptysis , cough, dyspnea, wheezing, chest pain, palpitations, orthopnea, edema, abdominal pain,  nausea, melena, diarrhea, constipation, flank pain, dysuria, hematuria, urinary  Frequency, nocturia, numbness, tingling, seizures,  Focal weakness, Loss of consciousness,  Tremor, insomnia, depression, anxiety, and suicidal ideation.     Objective:  BP (!) 148/88 (BP Location: Left Arm, Patient Position: Sitting, Cuff Size: Normal)   Pulse 80   Temp (!) 97.4 F (36.3 C) (Temporal)   Resp 15   Ht 5' 4.75" (1.645 m)   Wt 162 lb (73.5 kg)   SpO2 97%   BMI 27.17 kg/m   Physical Exam  General appearance: alert, cooperative and appears stated age Head: Normocephalic, without obvious abnormality, atraumatic Eyes: conjunctivae/corneas clear. PERRL, EOM's intact. Fundi benign. Ears: normal TM's and external ear canals both ears Nose: Nares normal. Septum midline. Mucosa normal. No drainage or sinus tenderness. Throat: lips, mucosa, and tongue normal; teeth and gums normal Neck: no adenopathy, no carotid bruit, no JVD, supple, symmetrical, trachea midline and thyroid not enlarged, symmetric, no tenderness/mass/nodules Lungs: clear to auscultation bilaterally Breasts: normal appearance, no masses or tenderness Heart: regular rate and rhythm, occasional PAC's S1, S2 normal, no murmur, click, rub or gallop Abdomen: soft,  non-tender; bowel sounds normal; no masses,  no organomegaly Extremities: extremities normal, atraumatic, no cyanosis or edema Pulses: 2+ and symmetric Skin: Skin color, texture, turgor normal. No rashes or lesions Neurologic: Alert and oriented X 3, normal strength and tone. Normal symmetric reflexes. Normal coordination and gait.    Assessment & Plan:   Problem List Items Addressed This Visit      Unprioritized   Routine general medical examination at a health care facility    age appropriate education and counseling updated, referrals for preventative services and immunizations addressed, dietary and smoking counseling addressed, most recent labs reviewed.  I have  personally reviewed and have noted:  1) the patient's medical and social history 2) The pt's use of alcohol, tobacco, and illicit drugs 3) The patient's current medications and supplements 4) Functional ability including ADL's, fall risk, home safety risk, hearing and visual impairment 5) Diet and physical activities 6) Evidence for depression or mood disorder 7) The patient's height, weight, and BMI have been recorded in the chart  I have made referrals, and provided counseling and education based on review of the above      Essential hypertension    She has deferred treatment continually but has not checked her blood pressure       Diabetes mellitus type 2, controlled, without complications (HCC) - Primary    Diagnosed  In 2018  With patient deferring biannual follow up.  . Patient is not up-to-date on eye exams; foot exam is normal today. Patient has no history of  microalbuminuria.   Lab Results  Component Value Date   HGBA1C 6.0 09/04/2016   Lab Results  Component Value Date   MICROALBUR 0.7 06/05/2016         Relevant Orders   Hemoglobin A1c   Microalbumin / creatinine urine ratio   Lipid panel   Direct LDL   Hyperlipidemia due to type 2 diabetes mellitus (HCC)    Improved with diet.  Using the Framingham risk calculator,  her 10 year risk of coronary artery disease is now lower but still elevated at 17%, . I have l recommended starting aspirin and statin  But she has deferred.. repeat lipids are due.   Lab Results  Component Value Date   CHOL 235 (H) 09/04/2016   HDL 54.10 09/04/2016   LDLCALC 144 (H) 09/04/2016   LDLDIRECT 171.0 06/05/2016   TRIG 182.0 (H) 09/04/2016   CHOLHDL 4 09/04/2016          Other Visit Diagnoses    Palpitations       Relevant Orders   TSH   Magnesium   Comprehensive metabolic panel   Vitamin D deficiency       Relevant Orders   VITAMIN D 25 Hydroxy (Vit-D Deficiency, Fractures)   Need for 23-polyvalent pneumococcal  polysaccharide vaccine       Relevant Orders   Pneumococcal polysaccharide vaccine 23-valent greater than or equal to 2yo subcutaneous/IM (Completed)      I have discontinued Delia K. Silvestri's metFORMIN, Bee Burgess, guaiFENesin, cholecalciferol, Liraglutide -Weight Management, nitrofurantoin (macrocrystal-monohydrate), and losartan-hydrochlorothiazide. I am also having her start on Zoster Vaccine Adjuvanted. Additionally, I am having her maintain her Loratadine and acetaminophen.  Meds ordered this encounter  Medications  . Zoster Vaccine Adjuvanted [facility]) injection    Sig: Inject 0.5 mLs into the muscle once for 1 dose.    Dispense:  1 each    Refill:  1    Medications Discontinued During This Encounter  Medication Reason  . Liraglutide -Weight Management (SAXENDA) 18 MG/3ML SOPN Prescription never filled  . Bee Burgess 580 MG CAPS Patient has not taken in last 30 days  . cholecalciferol (VITAMIN D) 1000 units tablet Patient has not taken in last 30 days  . guaiFENesin (MUCINEX) 600 MG 12 hr tablet Patient has not taken in last 30 days  . losartan-hydrochlorothiazide (HYZAAR) 50-12.5 MG tablet Patient has not taken in last 30 days  . metFORMIN (GLUCOPHAGE) 500 MG tablet Patient has not taken in last 30 days  . nitrofurantoin, macrocrystal-monohydrate, (MACROBID) 100 MG capsule Patient has not taken in last 30 days    Follow-up: No follow-ups on file.   Sherlene Shamseresa L Annalyse Langlais, MD

## 2019-04-06 NOTE — Assessment & Plan Note (Signed)
She has deferred treatment continually but has not checked her blood pressure

## 2019-04-06 NOTE — Assessment & Plan Note (Signed)

## 2019-04-07 LAB — TSH: TSH: 3.13 u[IU]/mL (ref 0.35–4.50)

## 2019-04-07 LAB — HEMOGLOBIN A1C: Hgb A1c MFr Bld: 6.5 % (ref 4.6–6.5)

## 2019-04-07 LAB — COMPREHENSIVE METABOLIC PANEL
ALT: 20 U/L (ref 0–35)
AST: 16 U/L (ref 0–37)
Albumin: 4.6 g/dL (ref 3.5–5.2)
Alkaline Phosphatase: 70 U/L (ref 39–117)
BUN: 20 mg/dL (ref 6–23)
CO2: 29 mEq/L (ref 19–32)
Calcium: 9.3 mg/dL (ref 8.4–10.5)
Chloride: 99 mEq/L (ref 96–112)
Creatinine, Ser: 0.8 mg/dL (ref 0.40–1.20)
GFR: 71.96 mL/min (ref >60.00)
Glucose, Bld: 97 mg/dL (ref 70–99)
Potassium: 3.5 mEq/L (ref 3.5–5.1)
Sodium: 137 mEq/L (ref 135–145)
Total Bilirubin: 0.3 mg/dL (ref 0.2–1.2)
Total Protein: 7.2 g/dL (ref 6.0–8.3)

## 2019-04-07 LAB — MICROALBUMIN / CREATININE URINE RATIO
Creatinine,U: 25 mg/dL
Microalb Creat Ratio: 2.8 mg/g (ref 0.0–30.0)
Microalb, Ur: <0.7 mg/dL (ref 0.0–1.9)

## 2019-04-07 LAB — LIPID PANEL
Cholesterol: 231 mg/dL — ABNORMAL HIGH (ref 0–200)
HDL: 50.6 mg/dL (ref >39.00)
NonHDL: 180.3
Total CHOL/HDL Ratio: 5
Triglycerides: 328 mg/dL — ABNORMAL HIGH (ref 0.0–149.0)
VLDL: 65.6 mg/dL — ABNORMAL HIGH (ref 0.0–40.0)

## 2019-04-07 LAB — VITAMIN D 25 HYDROXY (VIT D DEFICIENCY, FRACTURES): VITD: 25.9 ng/mL — ABNORMAL LOW (ref 30.00–100.00)

## 2019-04-07 LAB — LDL CHOLESTEROL, DIRECT: Direct LDL: 149 mg/dL

## 2019-04-07 LAB — MAGNESIUM: Magnesium: 1.7 mg/dL (ref 1.5–2.5)

## 2021-02-28 ENCOUNTER — Encounter: Payer: Self-pay | Admitting: ALLERGY & IMMUNOLOGY

## 2021-02-28 DIAGNOSIS — R053 Chronic cough: Secondary | ICD-10-CM

## 2021-04-11 ENCOUNTER — Ambulatory Visit: Admit: 2021-04-11 | Discharge: 2021-04-11 | Disposition: A | Discharge status: Home / Self Care | Payer: Self-pay

## 2021-05-09 ENCOUNTER — Encounter: Payer: Self-pay | Admitting: Otolaryngology

## 2021-05-09 NOTE — Progress Notes (Signed)
Received fax from [facility] stating images for CT Chest wo Con  completed on 04/11/2021 was pushed. Reviewed images and archive submitted.    Thank you,     Halil Rentz Investment banker, operational / ENT Support  Dept of Otolaryngology/ Dental Clinic   Ph: [phone]   Fax: [phone]

## 2021-05-10 ENCOUNTER — Other Ambulatory Visit: Payer: Self-pay | Admitting: Otolaryngology

## 2021-06-27 ENCOUNTER — Other Ambulatory Visit: Payer: Self-pay | Admitting: Otolaryngology

## 2021-06-27 ENCOUNTER — Ambulatory Visit: Payer: Medicare Other | Attending: Otolaryngology

## 2021-06-27 DIAGNOSIS — J383 Other diseases of vocal cords: Secondary | ICD-10-CM | POA: Insufficient documentation

## 2021-06-27 DIAGNOSIS — R053 Chronic cough: Secondary | ICD-10-CM | POA: Insufficient documentation

## 2021-06-27 MED ORDER — GABAPENTIN 300 MG CAPSULE
ORAL_CAPSULE | ORAL | 0 refills | Status: DC
Start: 2021-06-27 — End: 2021-07-24

## 2021-06-27 NOTE — Nursing Note (Signed)
Vital signs taken, allergies verified, screened for pain, med hx taken.   Age appropriate, interactive and cooperative. No acute distress noted at this time.     Patient WAS wearing a surgical mask.   Contact precautions were followed when caring for the patient.   PPE used by provider during encounter: Surgical mask       Avry Roedl, MA.   Medical Assistant II

## 2021-06-27 NOTE — Progress Notes (Signed)
[facility]  [employer] [place], [place]      Dear Dr. Sarita Haver:    Dr. Ulice Brilliant and I had the pleasure of seeing Brycelyn Gambino at the [facility] the [place] [facility] [place], Earlene Plater in consultation on your extremely kind recommendation.  Our full evaluation is below.    As you're aware, Ms. Maeleigh Buschman is a very kind female who has been suffering from chronic cough for the past several years. Below is a summary of her symptoms/workup:     Has had cough for 5-10 years; describes it as a dry cough; does not generally cough at night; cough can be triggered by vocal use; sometimes feels like her voice closes off (throat gets tight)  In the past she has had coughing fits that have caused gagging; this does not happen anymore; feels that overall cough has improved from where it used to be, however, no specific therapy she has tried has specifically helped  Has been treated with PPI in the past; does not have heartburn symptoms and did not note improvement in cough with PPI.  Has been treated for asthma (daily and rescue inhaler), however, was taken off of medications and had normal PFTs; inhalers did not improve cough  Did cough suppression therapy at which time he reports not getting much benefit.  15 years ago had nasal surgery - she is unsure for what - which helped her breathing  Saw an infectious disease specialist to work up cough without improvement following treatment  She does not think she has used neuromodulator medications in the past  No significant dysphagia; 20 years ago she had recurrent pneumonias  Recent CT chest negative for parenchymal pathology to explain cough; history of radiation to the chest for breast cancer    Past Medical History:      No past medical history on file.    Medications:     No current outpatient medications on file.    Allergies:    Allergies as of 06/27/2021    (No Known Allergies)       Social History:             PHYSICAL  EXAMINATION    General: Well-nourished and developed, alert and appropriate, no distress, voice with grade 1.5 dysphonia, breathing quietly.    Eyes:  Pupils reactive, sclera clear, external ocular muscles intact, no nystagmus.      Ears:  External auditory canals patent      Nasal cavity:  No anterior lesions, masses or polyps.    Oral cavity/oropharynx:  Buccal mucosa is moist without lesions or masses, tongue midline and palate elevates symmetrically.       Neck:  There is no lymphadenopathy or thyromegaly.      Neurologic:  Cranial nerves II-XII grossly intact.      PROCEDURES  Patient failed a mirror exam due to limitations of equipment and the need for stroboscopy to assess glottic vibration and closure. Please see separate procedure note.     PREOPERATIVE DIAGNOSIS: Chronic cough    POSTOPERATIVE DIAGNOSIS: Chronic cough    PROCEDURE:  Strobovideolaryngoscopy    ANESTHESIA:  Topical    COMPLICATIONS:  None    SPECIMENS:  None    PROCEDURE IN DETAIL: The patient was seated in an upright position.  The nasal cavity was topically anesthetized with viscous lidocaine.  The stroboscopic microphone was held to the neck at the level of the larynx.  The KayPentax distal chip  video laryngoscope was passed through the nasal cavity.  The nasal cavity and nasopharynx were within normal limits.  The base of tongue revealed no lesions or masses. The following findings on stroboscopy were noted:    Appearance of pharynx/larynx/Other Structural Lesions: unremarkable  Vocal Fold Mobility              Right VF: full mobility              Left VF: slightly paretic  TVF Appearance              Edema/Erythema: none, left vocal fold is slightly more atrophic              Lesions/vibratory margin irregularities: none  Glottic Closure Pattern: anterior glottic gap with spindle-shaped closure at higher pitch   Muscle Tension Patterns:  none  Findings of laryngopharyngeal reflux (LPR): none  Other abnormal findings: none    The patient  tolerated the procedure well.       Impression:      1.  Chronic cough  Comment: Discussed that past workup had ruled out reflux, upper airway cough syndrome, and reactive airway disease. 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 Will treat for neuropathic cough.    PLAN:      We discussed the findings with Ms. Sharhonda Atwood and have recommended the following:    1. Trial gabapentin  2. Recommend cough suppression therapy  3. If no improvement with gabapentin can consider superior laryngeal nerve block and then possibly consider vocal fold augmentation if cough is recidivistic  4. Return to clinic in 6 weeks    Thank you very much for allowing our team to participate in the medical care of this pleasant patient.  I hope this letter finds you well.  If there is anything else we can do in regards to this or any other matter, please do not hesitate to contact me.    This patient was also seen and examined by Dr. Oliver Pila, who helped develop plan of care and is the supervising physician for this encounter.    Total time I spent in care (including preparing, reviewing tests and medical records, obtaining and/or reviewing separately obtained history, counseling and educating, submitting orders and referrals, communicating with other healthcare professionals, documenting clinical information, independently interpreting results, medical decision making and care coordination) of this patient today (excluding time spent on other billable services) was 35 minutes.    Sincerely,     Electronically Signed by:    Hilbert Bible, MD  Otolaryngology Head and Neck Surgery Resident    Tonye Becket, MD  Laryngology and Bronchoesophagology Fellow  [facility]  Department of Otolaryngology - Head & Neck Surgery

## 2021-06-27 NOTE — Progress Notes (Deleted)
[facility]  [employer] [place], [place]      Dear Dr. ***:    Dr. Ulice Brilliant and I had the pleasure of seeing Kari Rosales at the [facility] the [place] [facility] [place], Earlene Plater in consultation on your extremely kind recommendation.  Our full evaluation is below.    As you're aware, Ms. Kari Rosales is a very kind female who has been suffering from dysphonia for the past several ***. The dysphonia is significantly bothersome and affects the quality of life.     ***  Has had cough for 5-10 years; describes it as a dry cough; does not generally cough at night; cough can be triggered by vocal use; sometimes feels like her voice closes off (throat gets tight)  In the past she has had coughing fits that have caused gagging; this does not happen anymore  Has been treated with PPI in the past; does not have heartburn symptoms  Has been treated for asthma (daily and rescue inhaler), however, was taken off of medications and had normal PFTs; inhalers did not improve cough  Did cough suppression therapy in the past  10-15 years ago was diagnosed with OSA on home sleep study and used CPAP without improvement of sleep; she stopped the CPAP; did in lab sleep study and did not have OSA  15 years ago had nasal surgery - she is unsure for what - which helped her breathing  Saw an infectious disease specialist to work up cough  She does not think she has used neuromodulator medications in the past  No significant dysphagia; 20 years ago she had recurrent pneumonias  Recent CT chest negative; history of radiation to the chest for breast cancer    Past Medical History:      No past medical history on file.    Medications:     No current outpatient medications on file.    Allergies:    Allergies as of 06/27/2021    (No Known Allergies)       Social History:             PHYSICAL EXAMINATION    General: Well-nourished and developed, alert and appropriate, no distress, voice ***, breathing  quietly.    Eyes:  Pupils reactive, sclera clear, external ocular muscles intact, no nystagmus.      Ears:  External auditory canals clear and tympanic membranes intact, no effusions.      Nasal cavity:  No anterior lesions, masses or polyps.    Oral cavity/oropharynx:  Buccal mucosa is moist without lesions or masses, tongue midline and palate elevates symmetrically.       Neck:  There is no lymphadenopathy or thyromegaly.      Neurologic:  Cranial nerves II-XII grossly intact.      Lungs:  Clear to auscultation all lobes.      PROCEDURES  Patient failed a mirror exam due to limitations of equipment and the need for stroboscopy to assess glottic vibration and closure. Please see separate procedure note.     PREOPERATIVE DIAGNOSIS: Dysphonia    POSTOPERATIVE DIAGNOSIS: Dysphonia***    PROCEDURE:  Strobovideolaryngoscopy    ANESTHESIA:  Topical    COMPLICATIONS:  None    SPECIMENS:  None    PROCEDURE IN DETAIL: The patient was seated in an upright position.  The nasal cavity was topically anesthetized with viscous lidocaine.  The stroboscopic microphone was held to the neck at the level of the larynx.  The KayPentax distal chip video laryngoscope was passed through the nasal cavity.  The nasal cavity and nasopharynx were within normal limits.  The base of tongue revealed no lesions or masses. The following findings on stroboscopy were noted:    Appearance of pharynx/larynx/Other Structural Lesions: ***  Vocal Fold Mobility              Right VF: ***              Left VF: ***  TVF Appearance              Edema/Erythema: ***              Lesions/vibratory margin irregularities: ***  Glottic Closure Pattern: ***   Vibration:              Phase: ***   Periodicity: ***               Amplitude: ***               Waveform: ***    Muscle Tension Patterns:  ***  Findings of laryngopharyngeal reflux (LPR): ***  Reflux Finding Score (RFS): ***  Other abnormal findings: ***    The patient tolerated the procedure well.        Impression:      1.  ***    PLAN:      We discussed the findings with Ms. Kari Rosales and have recommended the following:    1. ***    Thank you very much for allowing our team to participate in the medical care of this pleasant patient.  I hope this letter finds you well.  If there is anything else we can do in regards to this or any other matter, please do not hesitate to contact me.    This patient was also seen and examined by Dr. Oliver Pila, who helped develop plan of care and is the supervising physician for this encounter.    Total time I spent in care (including preparing, reviewing tests and medical records, obtaining and/or reviewing separately obtained history, counseling and educating, submitting orders and referrals, communicating with other healthcare professionals, documenting clinical information, independently interpreting results, medical decision making and care coordination) of this patient today (excluding time spent on other billable services) was 35 minutes.    Sincerely,     Electronically Signed by:    Tonye Becket, MD  Laryngology and Bronchoesophagology Fellow  [facility]  Department of Otolaryngology - Head & Neck Surgery

## 2021-06-28 NOTE — Progress Notes (Signed)
I saw the patient and was physically present for the visit and all procedures. All chart and procedure notes were reviewed. I am in complete agreement with the reported findings and treatment plan. I actively participated in all procedures and in the development of a comprehensive treatment plan.    Electronically Signed by:    Shequita Peplinski C. Laquanta Hummel, MD, PhD  Associate Professor  Department of Otolaryngology  Division of Laryngology & Bronchoesophagology

## 2021-07-16 ENCOUNTER — Telehealth: Payer: Self-pay | Admitting: Otolaryngology

## 2021-07-16 NOTE — Telephone Encounter (Signed)
X3    General Advice / Message to MD:    Requesting advice about gabapentin and her continued use and dosage. Per pt, she may need a refill and it may have to be a 90 day refill for insurance to cover.    [phone]          Sharlene Dory II  Patient Contact Center

## 2021-07-16 NOTE — Telephone Encounter (Signed)
X3    Non-Urgent Office Visit:    Requesting appointment for 3.1.2023 to be a video visit, Is this ok to change?    [phone]        Sharlene Dory II  Patient Contact Center

## 2021-07-17 MED ORDER — GABAPENTIN 300 MG CAPSULE
300.0000 mg | ORAL_CAPSULE | Freq: Three times a day (TID) | ORAL | 0 refills | Status: AC
Start: 2021-07-17 — End: 2022-07-12

## 2021-07-17 NOTE — Telephone Encounter (Signed)
Called patient and verified ID using 3 patient identifiers.    Needs 1 90 day refill of Gabapentin to make it until 3/1 follow up. Refill provided.    Pt started Gabapentin last week. Pt reports cough has not improved. Encourage pt to give gabapentin at least a month to see if it will help control cough. Pt reports she has been dizzy on gabapentin but it is improving. She may need to taper back down to twice daily if dizziness continues .    Pt not interested in SLN block at this time.  Ok to change upcoming appointment to video visit.         06/27/2021  "1. Trial gabapentin  2. Recommend cough suppression therapy  3. If no improvement with gabapentin can consider superior laryngeal nerve block and then possibly consider vocal fold augmentation if cough is recidivistic  4. Return to clinic in 6 weeks"        [facility] #[id number] - [place], [place] - [address] AT [facility] OF [address] [phone] [facility] [phone] FX    Theodis Sato, RN, BSN, [facility]  ENT Triage  [facility]  [phone]

## 2021-07-24 ENCOUNTER — Telehealth: Payer: Self-pay | Admitting: Otolaryngology

## 2021-07-24 MED ORDER — GABAPENTIN 300 MG CAPSULE
ORAL_CAPSULE | ORAL | 0 refills | Status: AC
Start: 2021-07-24 — End: 2021-11-05

## 2021-07-24 NOTE — Telephone Encounter (Signed)
Verify 3x  General Requests:    Requesting a refill of Gabapentin (NEURONTIN) 300 mg Capsule. The patient states that she will run out of medication tomorrow and needs a refill for 90 days    The patient requests Send to preferred pharmacy.        Cheron Schaumann II - Patient Contact Center  ENT - Otolaryngology  [phone]

## 2021-07-26 ENCOUNTER — Telehealth: Payer: Self-pay | Admitting: Speech-Language Pathologist

## 2021-07-26 ENCOUNTER — Ambulatory Visit
Payer: Medicare Other | Attending: Student in an Organized Health Care Education/Training Program | Admitting: Speech-Language Pathologist

## 2021-07-26 DIAGNOSIS — R053 Chronic cough: Secondary | ICD-10-CM

## 2021-07-26 NOTE — Telephone Encounter (Signed)
Hello,    I left a message for patient to call back. When the call is returned please assist with message below.                     Thank you and enjoy your day!    Katherine Saunders, PSR III Lead  Otolaryngology   Patient Contact Center  [phone]

## 2021-07-26 NOTE — Progress Notes (Unsigned)
Video Visit Note     I performed this clinical encounter by utilizing a real time telehealth video connection between my location and the patient's location. The patient's location was confirmed during this visit. I obtained verbal consent from the patient to perform this clinical encounter utilizing video and prepared the patient by answering any questions they had about the telehealth interaction.    I spent a total of 50 minutes today on this patient's visit. Due to audio difficulties on video visit, remainder of appointment was conducted via telephone visit today.     Comprehensive Voice Evaluation [phone]  Referring Physician: Laretta Bolster, MD   Referring Diagnosis: Chronic cough    A comprehensive voice evaluation [phone]) was completed to characterize the nature, severity, and etiology of the patient's voice problem. The session consisted of a voice case history, patient self-report questionnaires, and perceptual evaluation of the voice.     HISTORY: Kari Rosales is a 66yr female with a chief complaint of cough. She moved to [place] about about 40 years ago and started getting bronchiectasis 2x/year; however, the cough only started within the last decade. She had cough for 5-10 years. She describes it as a dry, non-productive cough. She describes a precipitating sensation of a tickle/irritation in the throat. She typically does not cough at night. Cough triggers include voice use. Sometimes she feels like her voice closes off (throat gets tight) during and after cough episodes. Her cough has improved overall in the past few years but still persists. She has been treated with PPI in the past (denies heartburn) but did not notice improvement in cough. She had normal PFTs but was previously treated for asthma with inhalers with no benefit in cough. She is no longer taking inhalers. She previously participated in cough therapy with no significant benefit, but is interested in trying again since it has been some  time since last attempt. She denies significant dysphagia. The patient acknowledges having a raspy voice but reports her voice does not bother her.    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 When [place] with him, she sometimes starts to cough, and throat spasms may follow. This affects her ability to talk (short term). She reports straining her voice while on the phone with him because he has hearing loss, and she suspects that this strain is contributing to her urge to cough.   Of note, pt reports these past few years have been stressful with personal and family illness/events.     She was evaluated by Dr. 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 She was recommended for trial of gabapentin and cough suppression therapy. Per Dr. Grover Canavan, if no improvement with gabapentin, can consider superior laryngeal nerve block and then possibly consider vocal fold augmentation if cough is recidivistic. She suspects her cough has improved slightly since starting Gabapentin. She has been keeping track of how many times she coughs per day and frequency has slowly reduced since starting the medication.     Additional past medical history is listed below.  No past medical history on file.  No past surgical history on file.    Current Outpatient Medications:     Gabapentin (NEURONTIN) 300 mg Capsule, Take 1 capsule by mouth 3 times daily., Disp: 270 capsule, Rfl: 0    Gabapentin (NEURONTIN) 300 mg Capsule, Take 1 capsule by mouth every day at bedtime for 7 days, THEN  1 capsule 2 times daily for 7 days, THEN 1 capsule 3 times daily. Ramp up dosage as described as tolerated.., Disp: 291 capsule, Rfl: 0    VOCAL HYGIENE:  <64 oz water per day  Tobacco: Never  Retired/NA    PERCEPTUAL ASSESSMENT: The patient's voice was mild dysphonic and characterized by a rough, low pitched vocal quality. Articulation was within  normal limits and speech intelligibility was approximately 100%. Speech rate was within normal limits. Resonance was throat focused with normal nasality. Consensus Auditory-Perceptual Evaluation of Voice (CAPE-V) results revealed overall mild dysphonia (22/100), mild roughness (22/100), no breathiness (0/100), mild strain (20/100), mildly abnormal pitch (20/100), and normal loudness (0/100). Of note, pt reports her voice quality is worse than normal today.     THERAPEUTIC INTERVENTION  Education was provided regarding the anatomy & physiology of the laryngeal mechanism as it relates to coughing (productive vs non-productive), throat-clearing, breathing, phonation, and swallowing. Information regarding vocal hygiene was provided, including information related to systemic and external hydration, reflux lifestyle modifications, and voice use. She is currently using a humidifier and was encouraged to increase daily hydration intake to optimize vocal hygiene.     The following preventative and reactive/rescue techniques were modeled and reviewed.     First signs of cough:  Sip water at first sensation of cough  Tuck chin and swallow hard (with or without water)  Used pursed lip breathing technique  Focus on forced exhalation and airflow at the lips  Hold a finger up to the lips and feel the airflow  Short, shallow inhalation (not too deep)  Alternate between sipping water and forced exhalation until sensation subsides  Abdominal of diaphragmatic breathing with relaxed throat breathing at rest during recover, as needed    Interrupt breakthrough cough:  Focus on forced exhalation with airflow at the lips  Shift the constriction of the cough from the throat to the lips  Strong thrust of air moves through larynx, giving sensation of blowing out what is experienced as obstructing the airway.  Use all of your energy to interrupt the cough.  Continue breathing techniques until cough subsides.   Begin to relax the breaths as  your cough subsides.  Begin sipping water once you have control of the cough.    Practice: 5 breaths, 5 times per day when not symptomatic     *Use these techniques any time you feel vulnerable to cough (e.g., when exposed to perfumes, irritants, cold air, hot air, etc.)    ASSESSMENT  Patient presents with persistent chronic cough and throat clear, and mild dysphonia characterized by roughness. She is not bothered by her voice at this time. Given length of cough and poor response to prior therapies, suspect largely neurogenic/habitual cough.  Contributing factors include poor hydration intake, history of URIs. She has noticed slight improvement in cough since beginning gabapentin. Reactive/rescue techniques for cough and throat clear were introduced, practiced, and provided in written form. The patient verbalized understanding and demonstrated accurate production today. Counseling was also provided on methods for optimizing vocal hygiene. She reports straining her voice while on the phone with her elderly father and is interested in further discussion on methods for minimizing strain during these phone calls. The patient is motivated to improve and is a good candidate for continued respiratory retraining therapy for chronic cough.     PLAN  Continue addressing goals as outlined above.  Follow-up with SLP in 2-3 weeks (Virtual visit) for continued respiratory retraining and counseling re: vocal  strain during phone calls  Patient was provided with a home exercise program (HEP) consisting of the following: Cough suppression techniques.     Long Term Goal(s):  Patient will improve vocal quality via implementation of vocal /techniques/exercise/hygiene to increase patient's ability to meet communication needs.   Patient will decrease/eliminate episodes of cough / throat clear.     Short Term Goal(s):  Patient will report implementing recommended strategies of increased vocal hygiene to improve vocal health.  Patient will  improve self-awareness by accurately identifying cough triggers and precipitating sensation independently with 90% accuracy.   Patient will report implementing preventative and interruptive cough suppression techniques independently with 90% accuracy.     Treatment Plan  Vocal Health/Hygiene Protocol  Respiratory Retraining/Abdominally-Based Respiration  Cough/Throat Clear Suppression    Evaluated by:  Bing Plume, M.S., CCC-SLP  Senior Speech-Language Pathologist  [facility]  Department of Otolaryngology  [phone]  [email or web address]

## 2021-07-30 NOTE — Progress Notes (Signed)
I'm in agreement with the assessment and recommendations of Jessica Pietrowski, CCC-SLP.    Electronically signed by:    Livia Tarr A Tanijah Morais, MD  Associate Professor  [facility]  Department of Otolaryngology

## 2021-08-11 ENCOUNTER — Other Ambulatory Visit: Payer: Self-pay | Admitting: Student in an Organized Health Care Education/Training Program

## 2021-08-14 ENCOUNTER — Encounter: Payer: Self-pay | Admitting: Otolaryngology

## 2021-08-14 NOTE — Telephone Encounter (Signed)
From: [name]  To: Otolaryngology [facility] [place]  Sent: 08/14/2021 9:32 AM PST  Subject: Medication concerns    Attached is a graph of my coughs each day. I tried to make it as accurate as possible, but I know I missed some.   I rarely cough at night during sleep. Most coughs are random, some triggered by food or extended talking and soothed/stopped by drinking water. The majority are dry coughs. I started graphing day 1 of 3 gabapentin/day dosage. Unfortunately, I never graphed a base line before medication. I don't see/feel that much of an improvement and have been on 3 pills/day for 30 days. I would like to be weaned off gaba if you feel it is not helping significantly. I felt very wobbly when I first started the gaba, now just tired and a little light-headed. However, I have developed hip joint pain which may not be related as it is listed as a rare side effect.   I don't have a follow-up appointment with you for 2 weeks and don't want to keep on the medication until then if you feel it's not really making that much of a difference. I have decided not to pursue the nerve block procedure at this time. I look forward to your response.

## 2021-08-14 NOTE — Telephone Encounter (Signed)
From: [name]  To: Otolaryngology [facility] [place]  Sent: 08/10/2021 10:59 AM PST  Subject: Fax Dr. Report of 06/27/2021 appointment     08/10/21. URGENT: Please FAX Dr Darden Dates report of my 06/27/2021 appointment to my referring physician, Dr Sarita Haver,   FAX: [phone]    Dr Pettigrew's other contact info:  [facility], [address], [place], [place], [address] [phone]     Thank you    Kari Rosales  [phone]  BD: Nov 20, 1955

## 2021-08-14 NOTE — Telephone Encounter (Signed)
General Advice / Message to MD:    Patient called back to ask if Dr.Belafsky can please read her message and respond back asap because she has an appointment with Dr.Pettigrew tomorrow at 9:45 and would like a response before that appointment if possible.     Patient can be reached at [phone]    [facility]  ENT PSR II  [phone]

## 2021-08-17 ENCOUNTER — Telehealth: Payer: Self-pay | Admitting: Otolaryngology

## 2021-08-17 ENCOUNTER — Encounter: Payer: Self-pay | Admitting: Student in an Organized Health Care Education/Training Program

## 2021-08-17 NOTE — Telephone Encounter (Signed)
Called patient and verified ID using 3 patient identifiers.    Reassurance provided, 600mg  gabapentin is a safe dose.    , RN, BSN, [facility]  ENT Triage  [facility]  [phone]

## 2021-08-17 NOTE — Telephone Encounter (Signed)
VERIFY X 3      General Advice / Message to MD:    Requesting advice about medication problem.   The problem started this morning.  Pt accidentally taken double the daily dosage for Gabapentin (NEURONTIN) 300 mg Capsule (Order [id number]).  Pt is concern.  Pt seeking advice and a phone call back..       [phone]          Larita Fife  Otolaryngology   Patient Contact Center   [phone]

## 2021-08-17 NOTE — Telephone Encounter (Signed)
From: [name]  To: Otolaryngology [facility] [place]  Sent: 08/17/2021 8:16 AM PST  Subject: Kari Rosales 300mg     Dr  Thank you so much for your reply. If you think my graph shows a decrease in coughs, I am perfectly willing to stay on the dosage Gabapentin until my appointment with Dr Ardyth Harps on March 7. I could also continue graphing coughs at 2 tab/day for a week and then 1 tab/day for a week so that I would have comparisons for Dr 11-07-1975 to look at . Please advise. I do not mind the side effects if you feel it is helping my cough.

## 2021-08-22 ENCOUNTER — Ambulatory Visit: Payer: Medicare Other | Admitting: Otolaryngology

## 2021-08-28 ENCOUNTER — Ambulatory Visit: Payer: Medicare Other | Admitting: Otolaryngology

## 2021-08-28 ENCOUNTER — Encounter: Payer: Self-pay | Admitting: Otolaryngology

## 2021-08-28 DIAGNOSIS — J370 Chronic laryngitis: Secondary | ICD-10-CM

## 2021-08-28 DIAGNOSIS — R053 Chronic cough: Secondary | ICD-10-CM

## 2021-08-28 NOTE — Telephone Encounter (Signed)
From: Devoria Albe  To: Garrison Columbus, MD  Sent: 08/27/2021 3:38 PM PST  Subject: Chart of weaning of gabapentin    Attached is a second chart of my approximate number of daily coughs while reducing down from 3 caps of gabapentin to 2caps of gabapentin ([place]) and 1cap of gabapentin (green). The long line is the original chart showing 2 caps for seven days then increasing to 3 caps for 30 days

## 2021-08-28 NOTE — Progress Notes (Signed)
OTOLARYNGOLOGY HEAD AND NECK SURGERY   VIRTUAL CHECK-IN  08/28/2021    I performed a video virtual check-in with Kari Rosales.  I obtained verbal consent from the patient to perform this clinical encounter.    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 We placed her on gabapentin. She has 7-10 coughs/day.     SUBJECTIVE: unremarkable comprehensive virtual examination. No coughing on the visit today    ASSESSMENT/PLAN: chronic cough. 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 No improvement with gabapentin. We discussed treatment options. We offered a SLN block on the left given the left vocal fold paresis. If no improvement from nerve block can consider left vocal fold injection medialization     Approximately 15 minutes were spent with the patient, reviewing imaging and endoscopic studies, and the record in the EMR. The patient understands and agrees with the plan of care outlined above.    Patient seen today via Telehealth by agreement and consent of patient in light of current COVID-19 pandemic. I used the following Telehealth technology (audio and video) during the visit. This patient encounter is appropriate and reasonable under the circumstances given the patient?s particular presentation at this time. The patient has been advised of the potential risks and limitations of this mode of treatment (including but not limited to the absence of in-person examination) and has agreed to be treated in a remote fashion in spite of them. Any and all of the patient?s/patient?s family?s questions on this issue have been answered and I have made no promises or guarantees to the patient. The patient has also been advised to contact this office for worsening conditions or problems, and seek emergency medical treatment and/or call 911 if the patient deems either necessary.?      Electronically Signed by:    Doree Fudge, MD, PhD,  Professor  Department of Otolaryngology  Division of Laryngology & Bronchoesophagology

## 2023-01-27 LAB — COLOGUARD: COLOGUARD: NEGATIVE

## 2023-02-11 ENCOUNTER — Ambulatory Visit: Payer: Medicare HMO | Admitting: Otolaryngology

## 2023-02-11 DIAGNOSIS — R053 Chronic cough: Secondary | ICD-10-CM

## 2023-02-11 NOTE — Progress Notes (Signed)
OTOLARYNGOLOGY HEAD AND NECK SURGERY   VIRTUAL CHECK-IN  02/11/2023    I performed a telephone virtual check-in with Kari Rosales.  I obtained verbal consent from the patient to perform this clinical encounter.    CHIEF COMPLAINT: chronic cough. 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 No significant improvement with gabapentin. We discussed treatment options. We offered a SLN block on the left given the left vocal fold paresis. If no improvement from nerve block can consider left vocal fold injection medialization.     SUBJECTIVE: unremarkable comprehensive virtual examination. No coughing on phone today.    ASSESSMENT/PLAN: Patient with chronic cough, extensive work up. Left vocal fold paresis. Had a lot of questions about SLN block. All questions answered. Had cough suppression therapy that did not help. Patient desires SLN block.had a long discussion with her about risks and benefits of the SLN block and she wishes to proceed. Will schedule for left SLN block.     Approximately 15 minutes were spent with the patient, reviewing imaging and endoscopic studies, and the record in the EMR. The patient understands and agrees with the plan of care outlined above.    Patient seen today via Telehealth by agreement and consent of patient in light of current COVID-19 pandemic. I used the following Telehealth technology (audio and video) during the visit. This patient encounter is appropriate and reasonable under the circumstances given the patient's particular presentation at this time. The patient has been advised of the potential risks and limitations of this mode of treatment (including but not limited to the absence of in-person examination) and has agreed to be treated in a remote fashion in spite of them. Any and all of the patient's/patient's family's questions on this issue have been answered and I have made no promises or guarantees to the patient. The patient has also been advised to contact this office for worsening  conditions or problems, and seek emergency medical treatment and/or call 911 if the patient deems either necessary."      Electronically Signed by:    Doree Fudge, MD, PhD, Professor  Department of Otolaryngology  Division of Laryngology & Bronchoesophagology

## 2023-02-25 ENCOUNTER — Ambulatory Visit: Payer: Medicare Other | Admitting: Otolaryngology

## 2023-03-05 ENCOUNTER — Ambulatory Visit: Payer: Medicare HMO | Attending: Otolaryngology | Admitting: Otolaryngology

## 2023-03-05 VITALS — BP 160/82 | HR 68 | Temp 96.6°F | Resp 20

## 2023-03-05 DIAGNOSIS — G522 Disorders of vagus nerve: Secondary | ICD-10-CM | POA: Insufficient documentation

## 2023-03-05 DIAGNOSIS — R053 Chronic cough: Secondary | ICD-10-CM | POA: Insufficient documentation

## 2023-03-05 NOTE — Nursing Note (Signed)
Patient identification verified x 2 Kari Rosales [birth year]). Patient roomed, appropriate vitals obtained, pain level and barriers assessed, and  medications reconciled.        Earl Many, LVN

## 2023-03-05 NOTE — Progress Notes (Signed)
Patient: Kari Rosales  MRN: [id number] Sex: female Age: 27yr  Date of Service: 03/05/2023 Date of Birth: 09-21-1955  Attending Surgeon: Oliver Pila, M.D.    [facility] [employer]   RETURN VISIT    [id number]    IMPRESSION AND PLAN   I discussed the findings with the patient and have recommended the following  SLN injection can be repeated again after 3 month if it helped improving cough, if she gained minimal benefit from it we can repeat on the other side in one to two weeks       SUBJECTIVE   As you're aware, [name] is a very kind female who has been suffering from cough, hoarse voice and left vocal cord weakness, had a trial of Gabapentin , lead to drowsiness so she stopped. She is here today for trial of SLN injection       Procedure        Pre-procedure diagnosis: Vagal neuropathy    Post-procedure diagnosis: Same      Code: 08657    Procedure: Vagal Nerve Block, Superior Laryngeal Nerve Branch    Surgeon: Oliver Pila, M.D.     Procedure in detail: The patient was placed upright in the examination chair. Informed consent was obtained with all of the risks outlined. The region over the left superior laryngeal nerve was prepped in the usual sterile fashion. Then 2cc of a 50/50 mixture of .5% marcaine/ Kenalog 40mg /cc was injected to achieve blockage of the SLN. The patient tolerated the procedure well without complication.    Patient with chronic cough, failure of other treatments. Desired SLN block. Procedure performed without incident.    Will follow-up 2 weeks if desires right  SLN block. Otherwise will follow-up as needed.    Can also consider injection medialization if no improvement.    Electronically Signed by:    Doree Fudge, MD, PhD, Professor  Department of Otolaryngology  Division of Laryngology & Bronchoesophagology

## 2023-03-19 ENCOUNTER — Ambulatory Visit: Payer: Medicare HMO | Attending: Otolaryngology | Admitting: Otolaryngology

## 2023-03-19 VITALS — BP 142/70 | HR 68 | Temp 96.8°F | Ht 70.0 in | Wt 150.1 lb

## 2023-03-19 DIAGNOSIS — J370 Chronic laryngitis: Secondary | ICD-10-CM | POA: Insufficient documentation

## 2023-03-19 DIAGNOSIS — R053 Chronic cough: Secondary | ICD-10-CM | POA: Insufficient documentation

## 2023-03-19 DIAGNOSIS — J387 Other diseases of larynx: Secondary | ICD-10-CM | POA: Insufficient documentation

## 2023-03-19 DIAGNOSIS — G522 Disorders of vagus nerve: Secondary | ICD-10-CM | POA: Insufficient documentation

## 2023-03-19 NOTE — Progress Notes (Signed)
[facility]  [employer], [facility]    OTOLARYNGOLOGY PROGRESS RECORD    I had the absolute pleasure of seeing Kari Rosales at the [facility] for a follow-up evaluation. Kari Rosales is an extremely pleasant female who we have been following for a chief complaint of chronic cough. She is status-post left SLN block 2 weeks ago with mild improvement. She desired a right  SLN block today.        PHYSICAL EXAMINATION:    General appearance: Well-nourished, well-developed, alert and oriented x3, no acute distress.      Eyes:  Pupils 3-4 mm, reactive; external ocular muscles intact, sclera clear, no nystagmus.      Ears:  External auditory canals, tympanic membranes and middle ears within normal limits.  No evidence of any tympanic membrane perforation or effusion.      Nasal cavity:  No anterior lesions, masses or polyps.    Oral cavity/oropharynx:  No lesions or masses.  The palate elevates symmetrically.  The buccal mucosa is moist.      Neck:  There is no lymphadenopathy or thyromegaly.  No audible bruits.      Neurologic:  Cranial nerves II-XII grossly intact, no focal deficits.      Lungs:  Clear to auscultation all lobes.    Pre-procedure diagnosis: Vagal neuropathy    Post-procedure diagnosis: Same      Code: 16109    Procedure: Vagal Nerve Block, Superior Laryngeal Nerve Branch    Surgeon: Oliver Pila, M.D.     Procedure in detail: The patient was placed upright in the examination chair. Informed consent was obtained with all of the risks outlined. The region over the right  superior laryngeal nerve was prepped in the usual sterile fashion. Then 2cc of a 50/50 mixture of .5% marcaine/ Kenalog 40mg /cc was injected to achieve blockage of the SLN. The patient tolerated the procedure well without complication.    Impression/Plan  Patient with chronic cough status-post left now right  SLN block  Can follow-up repeat SLN block as needed   If no improvement in cough  have referred her to [employer] cough clinical trial    Electronically Signed by:    Doree Fudge, MD, PhD, Professor  Department of Otolaryngology  Division of Laryngology & Bronchoesophagology

## 2023-03-24 MED FILL — triamcinolone acetonide 40 mg/mL suspension for injection: INTRAMUSCULAR | Qty: 1 | Status: AC

## 2023-03-31 ENCOUNTER — Telehealth: Payer: Self-pay | Admitting: Otolaryngology

## 2023-03-31 NOTE — Telephone Encounter (Signed)
VERIFY X 3      Pt requesting botox appt, please assist with order and appt for pt.        Larita Fife  Otolaryngology  PSR II / [facility]

## 2023-04-03 ENCOUNTER — Ambulatory Visit: Payer: Medicare HMO | Admitting: Otolaryngology

## 2023-04-07 MED FILL — triamcinolone acetonide 40 mg/mL suspension for injection: INTRAMUSCULAR | Qty: 1 | Status: AC

## 2023-05-12 ENCOUNTER — Other Ambulatory Visit: Payer: Self-pay

## 2023-05-26 NOTE — Telephone Encounter (Signed)
Non-Urgent Office Visit:    Requesting an appointment for BOTOX .   Requested time is patient is scheduled for 12/4, but was under the impression her appt was for January. She is unable to make the 12/4 appt and would like to reschedule for January, February or March.   No schedules pull up for me when trying reschedule.  Please contact patient to reschedule.      Thank you,    Donata Duff  - PSR III  Patient Contact Center  Otolaryngology        [facility]:  If unable to schedule appointment during the call, add patient to wait list and forward to MA pool].

## 2023-05-28 ENCOUNTER — Ambulatory Visit: Payer: Medicare HMO | Admitting: Otolaryngology

## 2023-06-23 ENCOUNTER — Telehealth: Payer: Self-pay | Admitting: Otolaryngology

## 2023-06-23 NOTE — Telephone Encounter (Signed)
 Pt calling to request for advice. She is wanting doctors opinion if she should proceed with upcoming appt as there has been minimal improvement. She does understand in some cases it takes about 3mos for improvement but since she has surpassed the timing, she is wanting to know if it is still worth the trip since it is 4hrs.     Pt still has coughing, headaches and minimal improvement on left side.    Pt requesting for advice and call back.      Thank you,   Jorene New    PSR II- [facility]  Otolaryngology

## 2023-07-03 NOTE — Telephone Encounter (Signed)
 General Message to MD :    Pt is calling back regarding previous message . Please advise .    Jasani Dolney  PSR ll  [facility]  [phone]

## 2023-07-17 NOTE — Telephone Encounter (Signed)
General Advice / Message to MD:    pt is calling in to follow up on her message below. She said she has a back injury and its a process to get to the appt. Pt moved her appt back to wait for MD's response    Pls advise and respond via mychart or call pt     Thank you,  Aura Camps  PSR II

## 2023-07-23 ENCOUNTER — Ambulatory Visit: Payer: Medicare HMO | Admitting: Otolaryngology

## 2023-07-30 ENCOUNTER — Ambulatory Visit: Payer: Medicare HMO | Admitting: Otolaryngology

## 2023-08-25 NOTE — Telephone Encounter (Signed)
 Verify 3x:    Pt called and would like to know if she should keep going due to minimal improvement and discuss, please see previous msg below.    Hortense Ramal [facility] II  Patient Contact Center  Otolaryngology  [phone]

## 2023-08-25 NOTE — Telephone Encounter (Signed)
 General Advice / Message to MD:    Patient caled and stated that her insurance covers telemedicine. Patient is currently schedule on 09/09/23, requesting for a sooner telemedicine to discuss the upcoming botox.    Best call back # [phone]   at any time. If not answered please leave a detailed voice message.      Thank you.      Josefa Half  PCC-PSR II   Otolaryngology

## 2023-08-25 NOTE — Telephone Encounter (Signed)
 Telephone call to patient--verified name, DOB, MRN and/or address at initiation of call.    Patient stated she would like Dr. Darden Dates input before making the 4 hour trip and hotel accommodations if the follow up 3rd injection will be effective. Patient reported minimal effectiveness with prior 2 injections.     Advised it is her choice to make. She may keep the appointment to discuss alternate or botox if choose to proceed with it. Patient to also check with insurance if televisit is covered.     Barriers to Learning: none  Patient/Family Understanding: verbalizes

## 2023-08-27 NOTE — Telephone Encounter (Signed)
 Called the patient and relayed the message:  Her appointment is the soonest for the provider. She is listed on the waitlist.  No further action required.    Thank you,    Telesia Ates Kari Rosales - [facility]  Otolaryngology

## 2023-09-03 ENCOUNTER — Ambulatory Visit: Payer: Medicare HMO | Admitting: Otolaryngology

## 2023-09-09 ENCOUNTER — Ambulatory Visit: Payer: Medicare HMO | Admitting: Otolaryngology

## 2023-09-09 DIAGNOSIS — R053 Chronic cough: Secondary | ICD-10-CM

## 2023-09-09 NOTE — Progress Notes (Signed)
 OTOLARYNGOLOGY HEAD AND NECK SURGERY   VIRTUAL CHECK-IN  09/09/2023    I performed a telephone virtual check-in with Kari Rosales.  I obtained verbal consent from the patient to perform this clinical encounter.    CHIEF COMPLAINT: patient with chronic refractory cough status-post recent repeat SLN block. She is approx. 10% better. Still coughing     SUBJECTIVE: unremarkable comprehensive virtual examination     ASSESSMENT/PLAN: patient with chronic cough status-post SLN block with limited improvement. I have recommended [employer] chronic cough clinical trial and follow-up if no improvement after clin ical trial. Contact info for trial given to patient. If patient returns for repeat SLN block can try bilateral nerve blocks.    Approximately 15 minutes were spent with the patient, reviewing imaging and endoscopic studies, and the record in the EMR. The patient understands and agrees with the plan of care outlined above.    Patient seen today via Telehealth by agreement and consent of patient in light of current COVID-19 pandemic. I used the following Telehealth technology (audio and video) during the visit. This patient encounter is appropriate and reasonable under the circumstances given the patient's particular presentation at this time. The patient has been advised of the potential risks and limitations of this mode of treatment (including but not limited to the absence of in-person examination) and has agreed to be treated in a remote fashion in spite of them. Any and all of the patient's/patient's family's questions on this issue have been answered and I have made no promises or guarantees to the patient. The patient has also been advised to contact this office for worsening conditions or problems, and seek emergency medical treatment and/or call 911 if the patient deems either necessary."      Electronically Signed by:    Doree Fudge, MD, PhD, Professor  Department of Otolaryngology  Division of Laryngology &  Bronchoesophagology

## 2023-10-01 ENCOUNTER — Ambulatory Visit: Payer: Medicare HMO | Admitting: Otolaryngology

## 2023-11-12 ENCOUNTER — Ambulatory Visit: Payer: Medicare HMO | Admitting: Otolaryngology

## 2023-12-18 ENCOUNTER — Telehealth: Payer: Self-pay | Admitting: Otolaryngology

## 2023-12-18 NOTE — Telephone Encounter (Signed)
 Reschedule Call    Outbound call to patient/ guardian to advise that appointment with Dr. Bud on [phone] has been cancelled due to the provider being unavailable.  Outcome: Left message for patient/guardian requesting a call back to the Department of Otolaryngology [phone]    Kari Rosales  PSR 2  [facility]  [phone]

## 2024-03-25 ENCOUNTER — Telehealth: Payer: Self-pay | Admitting: Otolaryngology

## 2024-03-25 NOTE — Telephone Encounter (Signed)
 Incoming Call  Patient Verification Completed    Patient requesting: New referral order    Caller Identity and Name:Maahs, Raphaella     Surgery within 6 months: No     Symptoms/Issue: Please update the Botox order so the patient can reschedule their appointment from 03/31/2024 .      Patient's preferred call back number: [phone]     Derick Hacking PSR II   PCC  OTOLARYNGOLOGY

## 2024-03-30 ENCOUNTER — Other Ambulatory Visit: Payer: Self-pay

## 2024-03-30 ENCOUNTER — Telehealth: Payer: Self-pay

## 2024-03-31 ENCOUNTER — Ambulatory Visit: Payer: Medicare HMO | Admitting: Otolaryngology

## 2024-04-15 NOTE — Telephone Encounter (Signed)
 VERIFY X 3      Pt called and cancelled SLN block appt 10/29, taking her ill father to his cardiology appt same day.  Pt requesting to reschedule SLN appt to January 2026 in [place] with Dr Tory.  Pt requesting call back    [phone]          Murtis Camp  Otolaryngology  PSR II / [facility]

## 2024-04-21 ENCOUNTER — Ambulatory Visit: Payer: Medicare HMO

## 2024-07-14 ENCOUNTER — Ambulatory Visit: Payer: Medicare HMO | Admitting: Otolaryngology

## 2024-09-08 ENCOUNTER — Ambulatory Visit: Payer: Medicare HMO | Admitting: Otolaryngology
